# Patient Record
Sex: Female | Born: 1995 | Race: White | Hispanic: No | Marital: Single | Smoking: Current every day smoker
Health system: Southern US, Community
[De-identification: ages and names within clinical notes are randomized; demographics above are authoritative.]

## PROBLEM LIST (undated history)

## (undated) DIAGNOSIS — F1911 Other psychoactive substance abuse, in remission: Secondary | ICD-10-CM

## (undated) DIAGNOSIS — Z789 Other specified health status: Secondary | ICD-10-CM

## (undated) HISTORY — PX: NO PAST SURGERIES: SHX2092

---
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
## 2015-05-27 ENCOUNTER — Emergency Department
Admission: EM | Admit: 2015-05-27 | Discharge: 2015-05-27 | Disposition: A | Discharge status: Home / Self Care | Payer: Self-pay | Attending: Emergency Medicine | Admitting: Emergency Medicine

## 2015-05-27 DIAGNOSIS — F1910 Other psychoactive substance abuse, uncomplicated: Secondary | ICD-10-CM

## 2015-05-27 DIAGNOSIS — F1110 Opioid abuse, uncomplicated: Secondary | ICD-10-CM | POA: Insufficient documentation

## 2015-05-27 NOTE — ED Provider Notes (Signed)
[facility] Emergency Department Provider Note  ____________________________________________  Time seen: Approximately 6:03 PM  I have reviewed the triage vital signs and the nursing notes.   HISTORY  Chief Complaint med clearance    HPI Deborah Lewis is a 19 y.o. female patient here for medical clearance to go to [facility] for heroin area and patient says she is otherwise healthy. Has no complaints does get some chills when she is withdrawing but otherwise has no fever chills coughing sneezing abdominal pain dysuria or any other complaints.   No past medical history on file. patient herself also denies any medical problems  There are no active problems to display for this patient.   No past surgical history on file.  No current outpatient prescriptions on file.  Allergies Review of patient's allergies indicates not on file.  No family history on file.  Social History Social History  Substance Use Topics  . Smoking status: Not on file  . Smokeless tobacco: Not on file  . Alcohol Use: Not on file   patient uses heroin   \Review of Systems Constitutional: No fever/chills Eyes: No visual changes. ENT: No sore throat. Cardiovascular: Denies chest pain. Respiratory: Denies shortness of breath. Gastrointestinal: No abdominal pain.  No nausea, no vomiting.  No diarrhea.  No constipation. Genitourinary: Negative for dysuria. Musculoskeletal: Negative for back pain. Skin: Negative for rash. Neurological: Negative for headaches, focal weakness or numbness.  10-point ROS otherwise negative.  ____________________________________________   PHYSICAL EXAM:  VITAL SIGNS: ED Triage Vitals  Enc Vitals Group     BP 05/27/15 1708 128/73 mmHg     Pulse Rate 05/27/15 1708 75     Resp 05/27/15 1708 18     Temp 05/27/15 1708 97.7 F (36.5 C)     Temp Source 05/27/15 1708 Oral     SpO2 05/27/15 1708 100 %     Weight 05/27/15 1708 99 lb (44.906 kg)   Height 05/27/15 1708  (1.626 m)     Head Cir --      Peak Flow --      Pain Score --      Pain Loc --      Pain Edu? --      Excl. in GC? --     Constitutional: Alert and oriented. Well appearing and in no acute distress. Eyes: Conjunctivae are normal. PERRL. EOMI. Head: Atraumatic. Nose: No congestion/rhinnorhea. Mouth/Throat: Mucous membranes are moist.  Oropharynx non-erythematous. Neck: No stridor.  Cardiovascular: Normal rate, regular rhythm. Grossly normal heart sounds.  Good peripheral circulation. Respiratory: Normal respiratory effort.  No retractions. Lungs CTAB. Gastrointestinal: Soft and nontender. No distention. No abdominal bruits. No CVA tenderness. Tattoos and piercing on abdomen Musculoskeletal: No lower extremity tenderness nor edema.  No joint effusions. Neurologic:  Normal speech and language. No gross focal neurologic deficits are appreciated. No gait instability. Skin:  Skin is warm, dry and intact. No rash noted. Psychiatric: Mood and affect are normal. Speech and behavior are normal.  ____________________________________________   LABS (all labs ordered are listed, but only abnormal results are displayed)  Labs Reviewed - No data to display ____________________________________________  EKG  ______________________________________  RADIOLOGY  ____________________________________________   PROCEDURES   ____________________________________________   INITIAL IMPRESSION / ASSESSMENT AND PLAN / ED COURSE  Pertinent labs & imaging results that were available during my care of the patient were reviewed by me and considered in my medical decision making (see chart for details).   ____________________________________________  FINAL CLINICAL IMPRESSION(S) / ED DIAGNOSES  Final diagnoses:  Drug abuse      Arnaldo Natal, MD 05/27/15 2340

## 2015-05-27 NOTE — ED Notes (Signed)
Here for med clearance for [facility]. Heroin x 2 years.

## 2017-08-21 NOTE — L&D Delivery Note (Addendum)
Delivery Note At 2:44 AM a viable female was delivered via Vaginal, Spontaneous (Presentation: vertex ; LOA ).  APGAR: 8, 9; weight pending .   Placenta status: intact, central cord insertion .  Cord: 3 vessel with the following complications: none.  Cord pH: N/A  Anesthesia:  None Episiotomy: None Lacerations: 2nd degree Suture Repair: 3.0 vicryl Est. Blood Loss (mL):  50  Mom to postpartum.  Baby to Couplet care / Skin to Skin.  Felisa Bonier, MD, PGY-1 Family Medicine - [facility] [place] 01/02/2018, 3:26 AM

## 2017-09-26 ENCOUNTER — Encounter: Payer: Self-pay | Admitting: *Deleted

## 2017-09-26 ENCOUNTER — Ambulatory Visit (INDEPENDENT_AMBULATORY_CARE_PROVIDER_SITE_OTHER): Payer: Self-pay | Admitting: *Deleted

## 2017-09-26 DIAGNOSIS — Z3201 Encounter for pregnancy test, result positive: Secondary | ICD-10-CM

## 2017-09-26 LAB — POCT PREGNANCY, URINE: PREG TEST UR: POSITIVE — AB

## 2017-09-26 NOTE — Progress Notes (Signed)
Patient presents to clinic for pregnancy test which is positive. Patient already took home test and is aware of pregnancy. Would like to start pnc here. Allergies and medications reviewed, pt already taking pnv. Verification of pregnancy given, pt to front to schedule asap appt.

## 2017-09-27 NOTE — Progress Notes (Signed)
I have reviewed the chart and agree with nursing staff's documentation of this patient's encounter.  Catalina AntiguaPeggy Ebb Carelock, MD 09/27/2017 7:23 AM

## 2017-10-24 ENCOUNTER — Encounter: Payer: Self-pay | Admitting: Obstetrics and Gynecology

## 2017-10-24 ENCOUNTER — Other Ambulatory Visit (HOSPITAL_COMMUNITY)
Admission: RE | Admit: 2017-10-24 | Discharge: 2017-10-24 | Disposition: A | Discharge status: Home / Self Care | Payer: Medicaid Other | Source: Ambulatory Visit | Attending: Obstetrics and Gynecology | Admitting: Obstetrics and Gynecology

## 2017-10-24 ENCOUNTER — Ambulatory Visit (INDEPENDENT_AMBULATORY_CARE_PROVIDER_SITE_OTHER): Payer: Medicaid Other | Admitting: Obstetrics and Gynecology

## 2017-10-24 DIAGNOSIS — Z87898 Personal history of other specified conditions: Secondary | ICD-10-CM

## 2017-10-24 DIAGNOSIS — O0930 Supervision of pregnancy with insufficient antenatal care, unspecified trimester: Secondary | ICD-10-CM

## 2017-10-24 DIAGNOSIS — O0933 Supervision of pregnancy with insufficient antenatal care, third trimester: Secondary | ICD-10-CM

## 2017-10-24 DIAGNOSIS — F1911 Other psychoactive substance abuse, in remission: Secondary | ICD-10-CM

## 2017-10-24 DIAGNOSIS — Z3400 Encounter for supervision of normal first pregnancy, unspecified trimester: Secondary | ICD-10-CM

## 2017-10-24 DIAGNOSIS — Z3403 Encounter for supervision of normal first pregnancy, third trimester: Secondary | ICD-10-CM | POA: Insufficient documentation

## 2017-10-24 DIAGNOSIS — O0990 Supervision of high risk pregnancy, unspecified, unspecified trimester: Secondary | ICD-10-CM | POA: Insufficient documentation

## 2017-10-24 NOTE — Patient Instructions (Signed)
Third Trimester of Pregnancy The third trimester is from week 28 through week 40 (months 7 through 9). The third trimester is a time when the unborn baby (fetus) is growing rapidly. At the end of the ninth month, the fetus is about 20 inches in length and weighs 6-10 pounds. Body changes during your third trimester Your body will continue to go through many changes during pregnancy. The changes vary from woman to woman. During the third trimester:  Your weight will continue to increase. You can expect to gain 25-35 pounds (11-16 kg) by the end of the pregnancy.  You may begin to get stretch marks on your hips, abdomen, and breasts.  You may urinate more often because the fetus is moving lower into your pelvis and pressing on your bladder.  You may develop or continue to have heartburn. This is caused by increased hormones that slow down muscles in the digestive tract.  You may develop or continue to have constipation because increased hormones slow digestion and cause the muscles that push waste through your intestines to relax.  You may develop hemorrhoids. These are swollen veins (varicose veins) in the rectum that can itch or be painful.  You may develop swollen, bulging veins (varicose veins) in your legs.  You may have increased body aches in the pelvis, back, or thighs. This is due to weight gain and increased hormones that are relaxing your joints.  You may have changes in your hair. These can include thickening of your hair, rapid growth, and changes in texture. Some women also have hair loss during or after pregnancy, or hair that feels dry or thin. Your hair will most likely return to normal after your baby is born.  Your breasts will continue to grow and they will continue to become tender. A yellow fluid (colostrum) may leak from your breasts. This is the first milk you are producing for your baby.  Your belly button may stick out.  You may notice more swelling in your hands,  face, or ankles.  You may have increased tingling or numbness in your hands, arms, and legs. The skin on your belly may also feel numb.  You may feel short of breath because of your expanding uterus.  You may have more problems sleeping. This can be caused by the size of your belly, increased need to urinate, and an increase in your body's metabolism.  You may notice the fetus "dropping," or moving lower in your abdomen (lightening).  You may have increased vaginal discharge.  You may notice your joints feel loose and you may have pain around your pelvic bone.  What to expect at prenatal visits You will have prenatal exams every 2 weeks until week 36. Then you will have weekly prenatal exams. During a routine prenatal visit:  You will be weighed to make sure you and the baby are growing normally.  Your blood pressure will be taken.  Your abdomen will be measured to track your baby's growth.  The fetal heartbeat will be listened to.  Any test results from the previous visit will be discussed.  You may have a cervical check near your due date to see if your cervix has softened or thinned (effaced).  You will be tested for Group B streptococcus. This happens between 35 and 37 weeks.  Your health care provider may ask you:  What your birth plan is.  How you are feeling.  If you are feeling the baby move.  If you have had   any abnormal symptoms, such as leaking fluid, bleeding, severe headaches, or abdominal cramping.  If you are using any tobacco products, including cigarettes, chewing tobacco, and electronic cigarettes.  If you have any questions.  Other tests or screenings that may be performed during your third trimester include:  Blood tests that check for low iron levels (anemia).  Fetal testing to check the health, activity level, and growth of the fetus. Testing is done if you have certain medical conditions or if there are problems during the  pregnancy.  Nonstress test (NST). This test checks the health of your baby to make sure there are no signs of problems, such as the baby not getting enough oxygen. During this test, a belt is placed around your belly. The baby is made to move, and its heart rate is monitored during movement.  What is false labor? False labor is a condition in which you feel small, irregular tightenings of the muscles in the womb (contractions) that usually go away with rest, changing position, or drinking water. These are called Braxton Hicks contractions. Contractions may last for hours, days, or even weeks before true labor sets in. If contractions come at regular intervals, become more frequent, increase in intensity, or become painful, you should see your health care provider. What are the signs of labor?  Abdominal cramps.  Regular contractions that start at 10 minutes apart and become stronger and more frequent with time.  Contractions that start on the top of the uterus and spread down to the lower abdomen and back.  Increased pelvic pressure and dull back pain.  A watery or bloody mucus discharge that comes from the vagina.  Leaking of amniotic fluid. This is also known as your "water breaking." It could be a slow trickle or a gush. Let your health care provider know if it has a color or strange odor. If you have any of these signs, call your health care provider right away, even if it is before your due date. Follow these instructions at home: Medicines  Follow your health care provider's instructions regarding medicine use. Specific medicines may be either safe or unsafe to take during pregnancy.  Take a prenatal vitamin that contains at least 600 micrograms (mcg) of folic acid.  If you develop constipation, try taking a stool softener if your health care provider approves. Eating and drinking  Eat a balanced diet that includes fresh fruits and vegetables, whole grains, good sources of protein  such as meat, eggs, or tofu, and low-fat dairy. Your health care provider will help you determine the amount of weight gain that is right for you.  Avoid raw meat and uncooked cheese. These carry germs that can cause birth defects in the baby.  If you have low calcium intake from food, talk to your health care provider about whether you should take a daily calcium supplement.  Eat four or five small meals rather than three large meals a day.  Limit foods that are high in fat and processed sugars, such as fried and sweet foods.  To prevent constipation: ? Drink enough fluid to keep your urine clear or pale yellow. ? Eat foods that are high in fiber, such as fresh fruits and vegetables, whole grains, and beans. Activity  Exercise only as directed by your health care provider. Most women can continue their usual exercise routine during pregnancy. Try to exercise for 30 minutes at least 5 days a week. Stop exercising if you experience uterine contractions.  Avoid heavy   lifting.  Do not exercise in extreme heat or humidity, or at high altitudes.  Wear low-heel, comfortable shoes.  Practice good posture.  You may continue to have sex unless your health care provider tells you otherwise. Relieving pain and discomfort  Take frequent breaks and rest with your legs elevated if you have leg cramps or low back pain.  Take warm sitz baths to soothe any pain or discomfort caused by hemorrhoids. Use hemorrhoid cream if your health care provider approves.  Wear a good support bra to prevent discomfort from breast tenderness.  If you develop varicose veins: ? Wear support pantyhose or compression stockings as told by your healthcare provider. ? Elevate your feet for 15 minutes, 3-4 times a day. Prenatal care  Write down your questions. Take them to your prenatal visits.  Keep all your prenatal visits as told by your health care provider. This is important. Safety  Wear your seat belt at  all times when driving.  Make a list of emergency phone numbers, including numbers for family, friends, the hospital, and police and fire departments. General instructions  Avoid cat litter boxes and soil used by cats. These carry germs that can cause birth defects in the baby. If you have a cat, ask someone to clean the litter box for you.  Do not travel far distances unless it is absolutely necessary and only with the approval of your health care provider.  Do not use hot tubs, steam rooms, or saunas.  Do not drink alcohol.  Do not use any products that contain nicotine or tobacco, such as cigarettes and e-cigarettes. If you need help quitting, ask your health care provider.  Do not use any medicinal herbs or unprescribed drugs. These chemicals affect the formation and growth of the baby.  Do not douche or use tampons or scented sanitary pads.  Do not cross your legs for long periods of time.  To prepare for the arrival of your baby: ? Take prenatal classes to understand, practice, and ask questions about labor and delivery. ? Make a trial run to the hospital. ? Visit the hospital and tour the maternity area. ? Arrange for maternity or paternity leave through employers. ? Arrange for family and friends to take care of pets while you are in the hospital. ? Purchase a rear-facing car seat and make sure you know how to install it in your car. ? Pack your hospital bag. ? Prepare the baby's nursery. Make sure to remove all pillows and stuffed animals from the baby's crib to prevent suffocation.  Visit your dentist if you have not gone during your pregnancy. Use a soft toothbrush to brush your teeth and be gentle when you floss. Contact a health care provider if:  You are unsure if you are in labor or if your water has broken.  You become dizzy.  You have mild pelvic cramps, pelvic pressure, or nagging pain in your abdominal area.  You have lower back pain.  You have persistent  nausea, vomiting, or diarrhea.  You have an unusual or bad smelling vaginal discharge.  You have pain when you urinate. Get help right away if:  Your water breaks before 37 weeks.  You have regular contractions less than 5 minutes apart before 37 weeks.  You have a fever.  You are leaking fluid from your vagina.  You have spotting or bleeding from your vagina.  You have severe abdominal pain or cramping.  You have rapid weight loss or weight gain.    You have shortness of breath with chest pain.  You notice sudden or extreme swelling of your face, hands, ankles, feet, or legs.  Your baby makes fewer than 10 movements in 2 hours.  You have severe headaches that do not go away when you take medicine.  You have vision changes. Summary  The third trimester is from week 28 through week 40, months 7 through 9. The third trimester is a time when the unborn baby (fetus) is growing rapidly.  During the third trimester, your discomfort may increase as you and your baby continue to gain weight. You may have abdominal, leg, and back pain, sleeping problems, and an increased need to urinate.  During the third trimester your breasts will keep growing and they will continue to become tender. A yellow fluid (colostrum) may leak from your breasts. This is the first milk you are producing for your baby.  False labor is a condition in which you feel small, irregular tightenings of the muscles in the womb (contractions) that eventually go away. These are called Braxton Hicks contractions. Contractions may last for hours, days, or even weeks before true labor sets in.  Signs of labor can include: abdominal cramps; regular contractions that start at 10 minutes apart and become stronger and more frequent with time; watery or bloody mucus discharge that comes from the vagina; increased pelvic pressure and dull back pain; and leaking of amniotic fluid. This information is not intended to replace advice  given to you by your health care provider. Make sure you discuss any questions you have with your health care provider. Document Released: 08/01/2001 Document Revised: 01/13/2016 Document Reviewed: 10/08/2012 Elsevier Interactive Patient Education  2017 [employer]  

## 2017-10-24 NOTE — Addendum Note (Signed)
Addended by: Natale MilchSTALLING, Xzavion Doswell D on: 10/24/2017 04:37 PM   Modules accepted: Orders

## 2017-10-24 NOTE — Progress Notes (Signed)
Subjective:  Deborah Lewis is a 22 y.o. G1P0 at 7180w0d being seen today for her first OB visit. EDD by uncertain LMP. Late prenatal care d/t to insurance reasons. No chronic medical problems or medications except H/O heroin drug abuse. Reports no use since June  She is currently monitored for the following issues for this high-risk pregnancy and has Supervision of normal first pregnancy, antepartum; Late prenatal care; and History of drug abuse on their problem list.  Patient reports no complaints.  Contractions: Not present. Vag. Bleeding: None.  Movement: Present. Denies leaking of fluid.   The following portions of the patient's history were reviewed and updated as appropriate: allergies, current medications, past family history, past medical history, past social history, past surgical history and problem list. Problem list updated.  Objective:   Vitals:   10/24/17 1451  BP: 115/84  Pulse: (!) 121  Weight: 46.9 kg (103 lb 6.4 oz)    Fetal Status: Fetal Heart Rate (bpm): 125   Movement: Present     General:  Alert, oriented and cooperative. Patient is in no acute distress.  Skin: Skin is warm and dry. No rash noted.   Cardiovascular: Normal heart rate noted  Respiratory: Normal respiratory effort, no problems with respiration noted  Abdomen: Soft, gravid, appropriate for gestational age. Pain/Pressure: Absent     Pelvic:  Cervical exam performed        Extremities: Normal range of motion.  Edema: None  Mental Status: Normal mood and affect. Normal behavior. Normal judgment and thought content.  Breast sym supple no nipple discharge no masses or adenopathy  Urinalysis:      Assessment and Plan:  Pregnancy: G1P0 at 5580w0d  1. Supervision of normal first pregnancy, antepartum Prenatal care and labs reviewed with pt Declines Flu vaccine - Cytology - PAP - Cervicovaginal ancillary only - Hemoglobinopathy evaluation - Cystic Fibrosis Mutation 97 - Culture, OB Urine - Obstetric  Panel, Including HIV - Genetic Screening - Hemoglobin A1c - US MFM OB COMP + 14 WK; Future  2. Late prenatal care   3. History of drug abuse UDS  Preterm labor symptoms and general obstetric precautions including but not limited to vaginal bleeding, contractions, leaking of fluid and fetal movement were reviewed in detail with the patient. Please refer to After Visit Summary for other counseling recommendations.  Return in about 2 weeks (around 11/07/2017) for OB visit.   Hermina StaggersErvin, [name], MD

## 2017-10-24 NOTE — Progress Notes (Signed)
Patient reports good fetal movement, unsure about exact dating. Pt denies pain and but complains of nausea and heartburn. FOB is involved. Pt is unsure of dating.

## 2017-10-24 NOTE — Addendum Note (Signed)
Addended by: Natale MilchSTALLING, BRITTANY D on: 10/24/2017 03:53 PM   Modules accepted: Orders

## 2017-10-26 LAB — CYTOLOGY - PAP
Adequacy: ABSENT
CHLAMYDIA, DNA PROBE: NEGATIVE
Candida vaginitis: NEGATIVE
Diagnosis: NEGATIVE
HPV: NOT DETECTED
Neisseria Gonorrhea: NEGATIVE
TRICH (WINDOWPATH): NEGATIVE

## 2017-10-30 ENCOUNTER — Ambulatory Visit (HOSPITAL_COMMUNITY)
Admission: RE | Admit: 2017-10-30 | Discharge: 2017-10-30 | Disposition: A | Discharge status: Home / Self Care | Payer: Medicaid Other | Source: Ambulatory Visit | Attending: Obstetrics and Gynecology | Admitting: Obstetrics and Gynecology

## 2017-10-30 ENCOUNTER — Other Ambulatory Visit: Payer: Self-pay | Admitting: Obstetrics and Gynecology

## 2017-10-30 ENCOUNTER — Encounter: Payer: Self-pay | Admitting: *Deleted

## 2017-10-30 ENCOUNTER — Other Ambulatory Visit: Payer: Medicaid Other

## 2017-10-30 DIAGNOSIS — Z3A29 29 weeks gestation of pregnancy: Secondary | ICD-10-CM

## 2017-10-30 DIAGNOSIS — O0933 Supervision of pregnancy with insufficient antenatal care, third trimester: Secondary | ICD-10-CM

## 2017-10-30 DIAGNOSIS — Z3689 Encounter for other specified antenatal screening: Secondary | ICD-10-CM

## 2017-10-30 DIAGNOSIS — Z3400 Encounter for supervision of normal first pregnancy, unspecified trimester: Secondary | ICD-10-CM

## 2017-10-31 ENCOUNTER — Other Ambulatory Visit: Payer: Self-pay | Admitting: *Deleted

## 2017-10-31 DIAGNOSIS — Z3493 Encounter for supervision of normal pregnancy, unspecified, third trimester: Secondary | ICD-10-CM

## 2017-10-31 NOTE — Progress Notes (Signed)
U/s orders entered per Dr Alysia PennaErvin order.  F/u for anatomy and growth.

## 2017-11-05 LAB — OBSTETRIC PANEL, INCLUDING HIV
Antibody Screen: NEGATIVE
BASOS ABS: 0 10*3/uL (ref 0.0–0.2)
Basos: 0 %
EOS (ABSOLUTE): 0.1 10*3/uL (ref 0.0–0.4)
Eos: 1 %
HIV SCREEN 4TH GENERATION: NONREACTIVE
Hematocrit: 34.1 % (ref 34.0–46.6)
Hemoglobin: 11.7 g/dL (ref 11.1–15.9)
Hepatitis B Surface Ag: NEGATIVE
IMMATURE GRANULOCYTES: 0 %
Immature Grans (Abs): 0 10*3/uL (ref 0.0–0.1)
LYMPHS ABS: 2.1 10*3/uL (ref 0.7–3.1)
Lymphs: 14 %
MCH: 31.4 pg (ref 26.6–33.0)
MCHC: 34.3 g/dL (ref 31.5–35.7)
MCV: 91 fL (ref 79–97)
MONOS ABS: 0.8 10*3/uL (ref 0.1–0.9)
Monocytes: 5 %
NEUTROS ABS: 11.8 10*3/uL — AB (ref 1.4–7.0)
NEUTROS PCT: 80 %
PLATELETS: 408 10*3/uL — AB (ref 150–379)
RBC: 3.73 x10E6/uL — AB (ref 3.77–5.28)
RDW: 13.5 % (ref 12.3–15.4)
RPR Ser Ql: NONREACTIVE
Rh Factor: NEGATIVE
Rubella Antibodies, IGG: 2.46 index (ref >0.99)
WBC: 14.9 10*3/uL — AB (ref 3.4–10.8)

## 2017-11-05 LAB — HEMOGLOBINOPATHY EVALUATION
HGB C: 0 %
HGB S: 0 %
HGB VARIANT: 0 %
Hemoglobin A2 Quantitation: 2.5 % (ref 1.8–3.2)
Hemoglobin F Quantitation: 0 % (ref 0.0–2.0)
Hgb A: 97.5 % (ref 96.4–98.8)

## 2017-11-05 LAB — HEMOGLOBIN A1C
ESTIMATED AVERAGE GLUCOSE: 103 mg/dL
Hgb A1c MFr Bld: 5.2 % (ref 4.8–5.6)

## 2017-11-05 LAB — CYSTIC FIBROSIS MUTATION 97

## 2017-11-07 ENCOUNTER — Other Ambulatory Visit: Payer: Self-pay

## 2017-11-07 ENCOUNTER — Ambulatory Visit (INDEPENDENT_AMBULATORY_CARE_PROVIDER_SITE_OTHER): Payer: Medicaid Other | Admitting: Obstetrics & Gynecology

## 2017-11-07 ENCOUNTER — Encounter: Payer: Self-pay | Admitting: Obstetrics and Gynecology

## 2017-11-07 VITALS — BP 111/67 | HR 81 | Wt 103.2 lb

## 2017-11-07 DIAGNOSIS — Z141 Cystic fibrosis carrier: Secondary | ICD-10-CM | POA: Insufficient documentation

## 2017-11-07 DIAGNOSIS — O26899 Other specified pregnancy related conditions, unspecified trimester: Secondary | ICD-10-CM

## 2017-11-07 DIAGNOSIS — Z3400 Encounter for supervision of normal first pregnancy, unspecified trimester: Secondary | ICD-10-CM

## 2017-11-07 DIAGNOSIS — Z6791 Unspecified blood type, Rh negative: Secondary | ICD-10-CM | POA: Insufficient documentation

## 2017-11-07 DIAGNOSIS — O0930 Supervision of pregnancy with insufficient antenatal care, unspecified trimester: Secondary | ICD-10-CM

## 2017-11-07 MED ORDER — PANTOPRAZOLE SODIUM 20 MG PO TBEC: 20.0000 mg | DELAYED_RELEASE_TABLET | Freq: Every day | ORAL | 2 refills | Status: DC

## 2017-11-07 NOTE — Progress Notes (Signed)
ROB. C/o NV, headaches 6/10 x 30 days.

## 2017-11-07 NOTE — Progress Notes (Signed)
   PRENATAL VISIT NOTE  Subjective:  Deborah Lewis is a 22 y.o. G1P0 at 1849w3d being seen today for ongoing prenatal care.  She is currently monitored for the following issues for this low-risk pregnancy and has Supervision of normal first pregnancy, antepartum; Late prenatal care; and History of drug abuse on their problem list.  Patient reports headache, heartburn and nausea.  Contractions: Not present. Vag. Bleeding: None.  Movement: Present. Denies leaking of fluid.   The following portions of the patient's history were reviewed and updated as appropriate: allergies, current medications, past family history, past medical history, past social history, past surgical history and problem list. Problem list updated.  Objective:   Vitals:   11/07/17 0950  BP: 111/67  Pulse: 81  Weight: 103 lb 3.2 oz (46.8 kg)    Fetal Status: Fetal Heart Rate (bpm): 149 Fundal Height: 30 cm Movement: Present     General:  Alert, oriented and cooperative. Patient is in no acute distress.  Skin: Skin is warm and dry. No rash noted.   Cardiovascular: Normal heart rate noted  Respiratory: Normal respiratory effort, no problems with respiration noted  Abdomen: Soft, gravid, appropriate for gestational age.  Pain/Pressure: Absent     Pelvic: Cervical exam deferred        Extremities: Normal range of motion.  Edema: None  Mental Status:  Normal mood and affect. Normal behavior. Normal judgment and thought content.   Assessment and Plan:  Pregnancy: G1P0 at 5249w3d  1. Supervision of normal first pregnancy, antepartum Reflux and nausea - pantoprazole (PROTONIX) 20 MG tablet; Take 1 tablet (20 mg total) by mouth daily.  Dispense: 30 tablet; Refill: 2  2. Late prenatal care Mild headache relieved by Tylenol  Preterm labor symptoms and general obstetric precautions including but not limited to vaginal bleeding, contractions, leaking of fluid and fetal movement were reviewed in detail with the  patient. Please refer to After Visit Summary for other counseling recommendations.  Return in about 2 weeks (around 11/21/2017). 2 hr GTT NV  Deborah DarterJames Bonney Berres, MD

## 2017-11-07 NOTE — Patient Instructions (Signed)

## 2017-11-12 ENCOUNTER — Other Ambulatory Visit: Payer: Medicaid Other

## 2017-11-12 ENCOUNTER — Other Ambulatory Visit: Payer: Self-pay | Admitting: *Deleted

## 2017-11-12 DIAGNOSIS — Z3493 Encounter for supervision of normal pregnancy, unspecified, third trimester: Secondary | ICD-10-CM

## 2017-11-12 NOTE — Progress Notes (Signed)
Order for MFM consult for +CF screening. Per Dr Alysia PennaErvin request.

## 2017-11-13 LAB — CBC

## 2017-11-13 LAB — RPR: RPR: NONREACTIVE

## 2017-11-13 LAB — HIV ANTIBODY (ROUTINE TESTING W REFLEX): HIV SCREEN 4TH GENERATION: NONREACTIVE

## 2017-11-13 LAB — GLUCOSE TOLERANCE, 1 HOUR: GLUCOSE, 1HR PP: 69 mg/dL (ref 65–199)

## 2017-11-21 ENCOUNTER — Encounter: Payer: Self-pay | Admitting: Obstetrics & Gynecology

## 2017-11-21 ENCOUNTER — Ambulatory Visit (HOSPITAL_COMMUNITY)
Admission: RE | Admit: 2017-11-21 | Discharge: 2017-11-21 | Disposition: A | Discharge status: Home / Self Care | Payer: Medicaid Other | Source: Ambulatory Visit | Attending: Certified Nurse Midwife | Admitting: Certified Nurse Midwife

## 2017-11-21 ENCOUNTER — Ambulatory Visit (INDEPENDENT_AMBULATORY_CARE_PROVIDER_SITE_OTHER): Payer: Medicaid Other | Admitting: Obstetrics & Gynecology

## 2017-11-21 DIAGNOSIS — O352XX0 Maternal care for (suspected) hereditary disease in fetus, not applicable or unspecified: Secondary | ICD-10-CM

## 2017-11-21 DIAGNOSIS — Z3400 Encounter for supervision of normal first pregnancy, unspecified trimester: Secondary | ICD-10-CM

## 2017-11-21 DIAGNOSIS — Z3403 Encounter for supervision of normal first pregnancy, third trimester: Secondary | ICD-10-CM

## 2017-11-21 DIAGNOSIS — Z3A32 32 weeks gestation of pregnancy: Secondary | ICD-10-CM | POA: Insufficient documentation

## 2017-11-21 LAB — POCT URINALYSIS DIPSTICK
Bilirubin, UA: NEGATIVE
Blood, UA: NEGATIVE
Glucose, UA: NEGATIVE
Ketones, UA: NEGATIVE
LEUKOCYTES UA: NEGATIVE
NITRITE UA: POSITIVE
Odor: POSITIVE
PH UA: 7.5 (ref 5.0–8.0)
PROTEIN UA: NEGATIVE
Spec Grav, UA: 1.01 (ref 1.010–1.025)
UROBILINOGEN UA: 0.2 U/dL

## 2017-11-21 NOTE — Patient Instructions (Signed)
CF Gene Mutation Testing CF gene mutation testing is a test to check for the type of gene that causes cystic fibrosis (CF). Cystic fibrosis is a condition that affects how salt moves in and out of cells. It results in lifelong problems with breathing and digestion. Genes provide the coding that determines things such as our hair and eye color, how tall we are, and what medical conditions we might develop. When a gene changes form, it is called a mutation. Cystic fibrosis is caused by this type of genetic mutation. We get one set of genetic codes from each of our biological parents. When both parents have the CF gene mutation, they can pass the condition to their children (hereditary). In this case, the child will have cystic fibrosis. If a child only receives one copy of the CF gene mutation, the child is a carrier. The child will not develop cystic fibrosis, but he or she can pass the gene mutation onto his or her children. It is possible to be a carrier of the CF gene mutation, even if you are healthy. Why am I having this test? Some people choose to be tested for the CF gene mutation before having children. A woman might choose to be tested during pregnancy (prenatal testing) to determine the risk of passing cystic fibrosis to her baby. In some states in the [place] this testing is required. Your health care provider may also recommend this test if you have signs and symptoms of cystic fibrosis or if you know that someone in your family has this condition. This test is a reliable way of determining whether you have cystic fibrosis. The test cannot determine how bad your symptoms will be. Some groups of people have a higher risk of carrying the CF gene mutation, such as:  People who are Caucasian.  People of Ashkenazi Jewish descent.  What kind of sample is taken? In most cases, a blood sample is taken for this test. For a woman who is having the test during pregnancy, the test requires a sample of  the fluid that surrounds the baby in the uterus (amniotic fluid) or a sample of tissue from the placenta. How do I prepare for this test? There is no preparation required for this test. How are the results reported? Your test results will be reported as either positive or negative. It is your responsibility to get your test results. Ask your health care provider or the department performing the test when your results will be ready. What do the results mean? A negative result on a test means that something is not present. A negative test result can show different findings. 1. If the test result is negative and you have no symptoms: ? It is likely that you do not have cystic fibrosis. ? It is likely that you are not a carrier of the gene mutation. ? Your chances of having a child with cystic fibrosis are low. 2. If the test result is negative but you have symptoms of cystic fibrosis: ? You may have a rare form of the condition or you may have another disease. ? Your chances of having a child with cystic fibrosis are slightly higher.  A positive result on a test means that something is present. A positive test result can show different findings. 1. If the test result indicates that you have a single mutation, and if you have no symptoms of the disease: ? You might be a carrier of the gene mutation. This increases your  chances of having a child with cystic fibrosis. 2. If the test result indicates that you have two gene mutations (one from each of your parents): ? It is likely that you have cystic fibrosis. 3. If you have CF gene mutation testing done before pregnancy or during pregnancy, and if you and your partner both have at least one copy of the CF gene mutation: ? Your chances of having a child with cystic fibrosis are high.  Talk with your health care provider to discuss your results, treatment options, and if necessary, the need for more tests. Talk with your health care provider if you  have any questions about your results. Talk with your health care provider to discuss your results, treatment options, and if necessary, the need for more tests. Talk with your health care provider if you have any questions about your results. This information is not intended to replace advice given to you by your health care provider. Make sure you discuss any questions you have with your health care provider. Document Released: 08/31/2004 Document Revised: 04/11/2016 Document Reviewed: 06/09/2015 Elsevier Interactive Patient Education  [employer]2018 Elsevier Inc.

## 2017-11-21 NOTE — Progress Notes (Signed)
   PRENATAL VISIT NOTE  Subjective:  Deborah Lewis is a 22 y.o. G1P0 at 6064w3d being seen today for ongoing prenatal care.  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  Patient reports no complaints.  Contractions: Not present. Vag. Bleeding: None.  Movement: Present. Denies leaking of fluid.   The following portions of the patient's history were reviewed and updated as appropriate: allergies, current medications, past family history, past medical history, past social history, past surgical history and problem list. Problem list updated.  Objective:   Vitals:   11/21/17 0920  BP: 117/70  Pulse: 67  Weight: 106 lb (48.1 kg)    Fetal Status: Fetal Heart Rate (bpm): 132 Fundal Height: 30 cm Movement: Present     General:  Alert, oriented and cooperative. Patient is in no acute distress.  Skin: Skin is warm and dry. No rash noted.   Cardiovascular: Normal heart rate noted  Respiratory: Normal respiratory effort, no problems with respiration noted  Abdomen: Soft, gravid, appropriate for gestational age.  Pain/Pressure: Absent     Pelvic: Cervical exam deferred        Extremities: Normal range of motion.  Edema: None  Mental Status: Normal mood and affect. Normal behavior. Normal judgment and thought content.   Assessment and Plan:  Pregnancy: G1P0 at 2464w3d  1. Supervision of normal first pregnancy, antepartum Needs urine testing - Culture, OB Urine - ToxASSURE Select 13 (MW), Urine  Preterm labor symptoms and general obstetric precautions including but not limited to vaginal bleeding, contractions, leaking of fluid and fetal movement were reviewed in detail with the patient. Please refer to After Visit Summary for other counseling recommendations.  Return in about 2  weeks (around 12/05/2017).  Future Appointments  Date Time Provider Department Center  11/21/2017 10:00 AM [facility] GENETIC COUNSELING [facility]  11/27/2017 10:45 AM [facility] US 2 [facility]  12/05/2017  9:30 AM Hermina StaggersErvin, Michael L, MD [facility] None    Scheryl DarterJames Arnold, MD

## 2017-11-21 NOTE — Progress Notes (Signed)
Genetic Counseling  Visit Summary Note  Appointment Date: 11/21/2017 Referred By: Morene Crocker, CNM  Date of Birth: 1996/04/14  Pregnancy history: G1P0 Estimated Date of Delivery: 01/13/18 Estimated Gestational Age: [redacted]w[redacted]d I met with [name] and her partner, Deborah Lewis for genetic counseling because routine cystic fibrosis carrier screening identified Deborah Lewis as a carrier for cystic fibrosis (CF).    In summary:  Discussed cystic fibrosis and autosomal recessive inheritance  Discussed availability of carrier screening for the father of the baby  Reviewed limitations of screening  Reviewed risks to the fetus prior to carrier screening for the father of the baby  Discussed option of prenatal diagnosis only when mutations have been identified in both parents  Declined amniocentesis at this time  Understand limitation of ultrasound in diagnosis of cf  No other family history concerns  We reviewed the results of Deborah Lewis's CF carrier screening.  Specifically, the name of the CFTR gene mutation she carries is [id number]. CF carrier screening has not yet been performed for her partner, [name]  [name] no additional relatives known to be CF carriers and no known relatives with cystic fibrosis.  Deborah Lewis no known individuals with cystic fibrosis in his family history, and consanguinity to Deborah Lewis was denied. He reported unknown Caucasian ancestry.   This couple was provided with written information regarding cystic fibrosis. Classic features of CF include thickened secretions in the lungs, digestive and reproductive systems. This life-limiting condition is characterized by chronic respiratory infections requiring daily chest therapies and pancreatic dysfunction disrupting the body's ability to break down food and extract nutrients as it should, which may restrict growth. Infertility commonly occurs in males. With therapies,  such as daily respiratory therapies and medications to aid digestion, the median lifespan for people with CF is increasing. Treatment may involve lung transplantation in some cases. There can be significant variability in the severity of symptoms and expression of the disease. Expression of the disease depends upon the specific mutations present in an individual with CF.   We spent time reviewing the autosomal recessive inheritance of CF. CF is a common genetic condition in the Caucasian population occurring in approximately 1 in 389,300Caucasian births. This means approximately 1 in 27 Caucasians is a CF carrier. We discussed that individuals who are carriers have one copy of the CFTR gene with a disease causing mutation, and their other CFTR gene copy functions correctly. Thus, carriers typically do not have associated medical symptoms. We discussed that when both parents are carriers for CF, each pregnancy has an independent chance for one of the following outcomes: a 25% chance to inherit both mutations and thus have CF; a 50% chance to inherit one gene mutation and be a carrier similar to parents; and a 25% chance to be neither a carrier nor have CF. When one parent is a CF carrier but the other is not, then each pregnancy has a 1 in 2 chance to be a CF carrier but would not be expected to be at increased risk to inherit CF.   There are known to be thousands of mutations which can cause the CFTR gene to not function properly. Carrier screening is available to assess for the most common disease causing mutations. However, carrier screening does not identify all CF carriers. Thus, a negative CF carrier screen would reduce, but not eliminate, the chance to be a CF carrier and thus the chance for CF in  a pregnancy.  Carrier screening for the most common mutations detects approximately 90% of carriers in the Caucasian population.  We reviewed that when both parents are identified to be CF carriers, prenatal  diagnosis via amniocentesis would be available, if desired. The risks, benefits, and limitations of amniocentesis were reviewed. A fetus with cystic fibrosis typically appears normal on targeted ultrasound, although rarely echogenic bowel is visualized. However, the presence of echogenic bowel on targeted ultrasound is not diagnostic for CF in a pregnancy, nor does the absence of echogenic bowel on ultrasound rule out CF in the pregnancy. We discussed that postnatal testing for CF can also be performed for babies identified to be at risk to inherit CF. They understand that in [place], the newborn screening test will detect CF, but that carriers may come back as false positives.  Given that Deborah Lewis reports no family history of CF, he would have the general population chance to be a carrier, or approximately 1 in 27, prior to carrier screening. Thus, if Deborah Lewis did not pursue carrier screening, the chance for an affected pregnancy is approximately 1 in 1.  We discussed that CF carrier screening for Deborah Lewis would further refine the risk for CF in the current pregnancy. Deborah Lewis declined carrier screening.  They would prefer to have a less than 1% chance for CF than worry about a 25% chance.    Deborah Lewis denied exposure to environmental toxins or chemical agents. She denied the use of alcohol, tobacco or street drugs. She denied significant viral illnesses during the course of her pregnancy. Her medical and surgical histories were noncontributory. Both family histories were reviewed and were otherwise negative for birth defects, mental retardation, and known genetic conditions. Without further information regarding the provided family history, an accurate genetic risk cannot be calculated. Further genetic counseling is warranted if more information is obtained.  I counseled this couple regarding the above risks and available options.  The approximate face-to-face time with the genetic  counselor was 45 minutes.  Cam Hai, MS Certified Genetic Counselor

## 2017-11-23 ENCOUNTER — Encounter: Payer: Self-pay | Admitting: *Deleted

## 2017-11-24 LAB — URINE CULTURE, OB REFLEX

## 2017-11-24 LAB — CULTURE, OB URINE

## 2017-11-27 ENCOUNTER — Other Ambulatory Visit: Payer: Self-pay | Admitting: Obstetrics and Gynecology

## 2017-11-27 ENCOUNTER — Ambulatory Visit (HOSPITAL_COMMUNITY)
Admission: RE | Admit: 2017-11-27 | Discharge: 2017-11-27 | Disposition: A | Discharge status: Home / Self Care | Payer: Medicaid Other | Source: Ambulatory Visit | Attending: Obstetrics and Gynecology | Admitting: Obstetrics and Gynecology

## 2017-11-27 ENCOUNTER — Other Ambulatory Visit (HOSPITAL_COMMUNITY): Payer: Self-pay | Admitting: *Deleted

## 2017-11-27 DIAGNOSIS — O0933 Supervision of pregnancy with insufficient antenatal care, third trimester: Secondary | ICD-10-CM | POA: Insufficient documentation

## 2017-11-27 DIAGNOSIS — Z3493 Encounter for supervision of normal pregnancy, unspecified, third trimester: Secondary | ICD-10-CM

## 2017-11-27 DIAGNOSIS — Z3A33 33 weeks gestation of pregnancy: Secondary | ICD-10-CM

## 2017-11-27 DIAGNOSIS — Z362 Encounter for other antenatal screening follow-up: Secondary | ICD-10-CM

## 2017-11-27 LAB — TOXASSURE SELECT 13 (MW), URINE

## 2017-11-27 NOTE — Addendum Note (Signed)
Encounter addended by: Vivien RotaSmall, Barry Culverhouse H, RT on: 11/27/2017 11:51 AM  Actions taken: Imaging Exam ended

## 2017-12-05 ENCOUNTER — Encounter: Payer: Medicaid Other | Admitting: Obstetrics and Gynecology

## 2017-12-11 ENCOUNTER — Encounter: Payer: Self-pay | Admitting: Obstetrics & Gynecology

## 2017-12-11 ENCOUNTER — Ambulatory Visit (INDEPENDENT_AMBULATORY_CARE_PROVIDER_SITE_OTHER): Payer: Medicaid Other | Admitting: Obstetrics & Gynecology

## 2017-12-11 VITALS — BP 99/67 | HR 71 | Wt 104.5 lb

## 2017-12-11 DIAGNOSIS — O365990 Maternal care for other known or suspected poor fetal growth, unspecified trimester, not applicable or unspecified: Secondary | ICD-10-CM | POA: Insufficient documentation

## 2017-12-11 DIAGNOSIS — O0930 Supervision of pregnancy with insufficient antenatal care, unspecified trimester: Secondary | ICD-10-CM

## 2017-12-11 DIAGNOSIS — Z3400 Encounter for supervision of normal first pregnancy, unspecified trimester: Secondary | ICD-10-CM

## 2017-12-11 DIAGNOSIS — Z141 Cystic fibrosis carrier: Secondary | ICD-10-CM

## 2017-12-11 DIAGNOSIS — Z6791 Unspecified blood type, Rh negative: Secondary | ICD-10-CM

## 2017-12-11 DIAGNOSIS — O26849 Uterine size-date discrepancy, unspecified trimester: Secondary | ICD-10-CM

## 2017-12-11 DIAGNOSIS — O26899 Other specified pregnancy related conditions, unspecified trimester: Secondary | ICD-10-CM

## 2017-12-11 DIAGNOSIS — O352XX0 Maternal care for (suspected) hereditary disease in fetus, not applicable or unspecified: Secondary | ICD-10-CM

## 2017-12-11 MED ORDER — PANTOPRAZOLE SODIUM 20 MG PO TBEC: 20.0000 mg | DELAYED_RELEASE_TABLET | Freq: Every day | ORAL | 2 refills | Status: DC

## 2017-12-11 NOTE — Patient Instructions (Signed)
Natural Childbirth Natural childbirth is going through labor and delivery without any drugs to relieve pain. Additionally, fetal monitors are not used, a delivery is not done, and a surgical cut to enlarge the vaginal opening (episiotomy) is not made. With the help of a birthing professional (midwife or health care provider), you direct your own labor and delivery. Many women choose natural childbirth because it makes them feel more in control and in touch with their labor and delivery. Some woman also choose natural childbirth because they are concerned about medicines affecting them and their babies. Pregnant women with a high-risk pregnancy should not attempt natural childbirth. It is better to deliver the infant in a hospital if an emergency situation arises. Sometimes, a health care provider has to get involved for the health and safety of the mother and infant. Techniques for natural childbirth  The Lamaze method-This method teaches parents that having a baby is normal, healthy, and natural. It also teaches the mother to take a neutral position regarding pain medicine and numbing medicines and to make an informed decision about using these medicines when the time comes.  The Bradley method (also called husband-coached birth)-This method teaches the father or partner to be the birth coach. It encourages the mother to exercise and eat a balanced, nutritious diet. It also involves relaxation and deep breathing exercises and preparing the parents for emergency situations. Methods of dealing with labor pain and delivery  Meditation.  Yoga.  Hypnosis.  Acupuncture.  Massage.  Changing positions (walking, rocking, showering, leaning on birth balls).  Lying in warm water or a whirlpool bath.  Finding an activity that keeps your mind off of the labor pain.  Listening to soft music.  Focusing on a particular object (visual imagery). Before going into labor  Be sure you and your spouse or  partner are in agreement about having a natural childbirth.  Decide if your health care provider or a midwife will deliver your baby.  Decide if you will have your baby in the hospital, at a birthing center, or at home.  If you have children, make plans to have someone take care of them when you go to the hospital or birthing center.  Know the distance and the time it takes to go to the hospital or birthing center. Practice going there and time it to be sure.  Have a bag packed with a nightgown, bathrobe, and toiletries. Be ready to take it with you when you go into labor.  Keep phone numbers of your family and friends handy if you need to call someone when you go into labor.  Your spouse or partner should go to all the natural childbirth technique classes.  Talk with your health care provider about the possibility of a medical emergency and what will happen if that occurs. Advantages of natural childbirth  You are in control of your labor and delivery.  You will not take medicines that could affect you and the baby.  There are no invasive procedures, such as an episiotomy.  You and your spouse or partner will work together, which can increase your bond with each other.  In most delivery centers, your family and friends can be involved in the labor and delivery process. Disadvantages of natural childbirth  You will experience pain during your labor and delivery.  The methods of helping relieve your labor pains may not work for you.  You may feel disappointed if you decide to change your mind during labor and not   have a natural childbirth. After the delivery  You will be very tired.  You will be uncomfortable because of your uterus contracting. You will feel soreness around the vagina.  You may feel cold and shaky. This is a natural reaction. This information is not intended to replace advice given to you by your health care provider. Make sure you discuss any questions you  have with your health care provider. Document Released: 07/20/2008 Document Revised: 01/13/2016 Document Reviewed: 04/14/2013 Elsevier Interactive Patient Education  2017 Elsevier Inc.  

## 2017-12-11 NOTE — Progress Notes (Signed)
   PRENATAL VISIT NOTE  Subjective:  Deborah Lewis is a 22 y.o. G1P0 at 3030w2d being seen today for ongoing prenatal care.  She is currently monitored for the following issues for this high-risk pregnancy and has Supervision of normal first pregnancy, antepartum; Late prenatal care; History of drug abuse; Rh negative, antepartum; Cystic fibrosis carrier; [redacted] weeks gestation of pregnancy; Hereditary disease in family possibly affecting fetus, affecting management of mother, antepartum condition or complication, not applicable or unspecified fetus; and Size of fetus inconsistent with dates, antepartum on their problem list.  Patient reports no complaints.  Contractions: Not present. Vag. Bleeding: None.  Movement: Present. Denies leaking of fluid.   The following portions of the patient's history were reviewed and updated as appropriate: allergies, current medications, past family history, past medical history, past social history, past surgical history and problem list. Problem list updated.  Objective:   Vitals:   12/11/17 1019  BP: 99/67  Pulse: 71  Weight: 104 lb 8 oz (47.4 kg)    Fetal Status:     Movement: Present     General:  Alert, oriented and cooperative. Patient is in no acute distress.  Skin: Skin is warm and dry. No rash noted.   Cardiovascular: Normal heart rate noted  Respiratory: Normal respiratory effort, no problems with respiration noted  Abdomen: Soft, gravid, appropriate for gestational age.  Pain/Pressure: Absent     Pelvic: Cervical exam deferred        Extremities: Normal range of motion.  Edema: None  Mental Status: Normal mood and affect. Normal behavior. Normal judgment and thought content.   Assessment and Plan:  Pregnancy: G1P0 at 3130w2d  1. Supervision of normal first pregnancy, antepartum  2. Rh negative, antepartum Need Rhogam post del pending blood type of infant  3. Late prenatal care  4. Hereditary disease in family possibly affecting fetus,  affecting management of mother, antepartum condition or complication, not applicable or unspecified fetus  5. Cystic fibrosis carrier  6. Size <dates.   Last US revealed 65th%ile  Has US scheduled for May 1  Preterm labor symptoms and general obstetric precautions including but not limited to vaginal bleeding, contractions, leaking of fluid and fetal movement were reviewed in detail with the patient. Please refer to After Visit Summary for other counseling recommendations.  Return in about 2 weeks (around 12/25/2017).  Future Appointments  Date Time Provider Department Center  12/19/2017  9:30 AM [facility] US 1 [facility]    Willodean Rosenthalarolyn Harraway-Smith, MD

## 2017-12-17 ENCOUNTER — Encounter (HOSPITAL_COMMUNITY): Payer: Self-pay

## 2017-12-18 ENCOUNTER — Other Ambulatory Visit: Payer: Self-pay | Admitting: Obstetrics & Gynecology

## 2017-12-18 DIAGNOSIS — N3000 Acute cystitis without hematuria: Secondary | ICD-10-CM

## 2017-12-18 MED ORDER — NITROFURANTOIN MONOHYD MACRO 100 MG PO CAPS: 100.0000 mg | ORAL_CAPSULE | Freq: Two times a day (BID) | ORAL | 1 refills | Status: DC

## 2017-12-19 ENCOUNTER — Ambulatory Visit (HOSPITAL_COMMUNITY)
Admission: RE | Admit: 2017-12-19 | Discharge: 2017-12-19 | Disposition: A | Discharge status: Home / Self Care | Payer: Medicaid Other | Source: Ambulatory Visit | Attending: Obstetrics and Gynecology | Admitting: Obstetrics and Gynecology

## 2017-12-19 ENCOUNTER — Other Ambulatory Visit (HOSPITAL_COMMUNITY): Payer: Self-pay | Admitting: *Deleted

## 2017-12-19 ENCOUNTER — Encounter (HOSPITAL_COMMUNITY): Payer: Self-pay

## 2017-12-19 ENCOUNTER — Other Ambulatory Visit (HOSPITAL_COMMUNITY): Payer: Self-pay | Admitting: Maternal & Fetal Medicine

## 2017-12-19 DIAGNOSIS — O99333 Smoking (tobacco) complicating pregnancy, third trimester: Secondary | ICD-10-CM | POA: Diagnosis not present

## 2017-12-19 DIAGNOSIS — Z3A36 36 weeks gestation of pregnancy: Secondary | ICD-10-CM

## 2017-12-19 DIAGNOSIS — O0933 Supervision of pregnancy with insufficient antenatal care, third trimester: Secondary | ICD-10-CM

## 2017-12-19 DIAGNOSIS — O365930 Maternal care for other known or suspected poor fetal growth, third trimester, not applicable or unspecified: Secondary | ICD-10-CM | POA: Diagnosis present

## 2017-12-19 DIAGNOSIS — Z362 Encounter for other antenatal screening follow-up: Secondary | ICD-10-CM

## 2017-12-19 DIAGNOSIS — O99323 Drug use complicating pregnancy, third trimester: Secondary | ICD-10-CM

## 2017-12-19 DIAGNOSIS — O365910 Maternal care for other known or suspected poor fetal growth, first trimester, not applicable or unspecified: Secondary | ICD-10-CM

## 2017-12-19 DIAGNOSIS — Z363 Encounter for antenatal screening for malformations: Secondary | ICD-10-CM | POA: Diagnosis not present

## 2017-12-19 HISTORY — DX: Other psychoactive substance abuse, in remission: F19.11

## 2017-12-25 ENCOUNTER — Encounter (HOSPITAL_COMMUNITY): Payer: Self-pay

## 2017-12-26 ENCOUNTER — Other Ambulatory Visit (HOSPITAL_COMMUNITY): Payer: Self-pay | Admitting: Obstetrics and Gynecology

## 2017-12-26 ENCOUNTER — Encounter: Payer: Self-pay | Admitting: Obstetrics & Gynecology

## 2017-12-26 ENCOUNTER — Ambulatory Visit (INDEPENDENT_AMBULATORY_CARE_PROVIDER_SITE_OTHER): Payer: Medicaid Other | Admitting: Obstetrics & Gynecology

## 2017-12-26 ENCOUNTER — Other Ambulatory Visit (HOSPITAL_COMMUNITY)
Admission: RE | Admit: 2017-12-26 | Discharge: 2017-12-26 | Disposition: A | Discharge status: Home / Self Care | Payer: Medicaid Other | Source: Ambulatory Visit | Attending: Obstetrics & Gynecology | Admitting: Obstetrics & Gynecology

## 2017-12-26 ENCOUNTER — Ambulatory Visit (HOSPITAL_COMMUNITY)
Admission: RE | Admit: 2017-12-26 | Discharge: 2017-12-26 | Disposition: A | Discharge status: Home / Self Care | Payer: Medicaid Other | Source: Ambulatory Visit | Attending: Obstetrics and Gynecology | Admitting: Obstetrics and Gynecology

## 2017-12-26 ENCOUNTER — Encounter: Payer: Medicaid Other | Admitting: Obstetrics & Gynecology

## 2017-12-26 ENCOUNTER — Encounter (HOSPITAL_COMMUNITY): Payer: Self-pay

## 2017-12-26 ENCOUNTER — Other Ambulatory Visit (HOSPITAL_COMMUNITY): Payer: Self-pay | Admitting: *Deleted

## 2017-12-26 VITALS — BP 111/79 | HR 98 | Wt 104.7 lb

## 2017-12-26 DIAGNOSIS — Z3A37 37 weeks gestation of pregnancy: Secondary | ICD-10-CM | POA: Insufficient documentation

## 2017-12-26 DIAGNOSIS — O99323 Drug use complicating pregnancy, third trimester: Secondary | ICD-10-CM | POA: Diagnosis not present

## 2017-12-26 DIAGNOSIS — O365930 Maternal care for other known or suspected poor fetal growth, third trimester, not applicable or unspecified: Secondary | ICD-10-CM

## 2017-12-26 DIAGNOSIS — O99333 Smoking (tobacco) complicating pregnancy, third trimester: Secondary | ICD-10-CM | POA: Insufficient documentation

## 2017-12-26 DIAGNOSIS — O0990 Supervision of high risk pregnancy, unspecified, unspecified trimester: Secondary | ICD-10-CM

## 2017-12-26 DIAGNOSIS — Z3400 Encounter for supervision of normal first pregnancy, unspecified trimester: Secondary | ICD-10-CM | POA: Insufficient documentation

## 2017-12-26 DIAGNOSIS — O26849 Uterine size-date discrepancy, unspecified trimester: Secondary | ICD-10-CM

## 2017-12-26 DIAGNOSIS — O0993 Supervision of high risk pregnancy, unspecified, third trimester: Secondary | ICD-10-CM

## 2017-12-26 NOTE — Patient Instructions (Signed)
Vaginal Delivery Vaginal delivery means that you will give birth by pushing your baby out of your birth canal (vagina). A team of health care providers will help you before, during, and after vaginal delivery. Birth experiences are unique for every woman and every pregnancy, and birth experiences vary depending on where you choose to give birth. What should I do to prepare for my baby's birth? Before your baby is born, it is important to talk with your health care provider about:  Your labor and delivery preferences. These may include: ? Medicines that you may be given. ? How you will manage your pain. This might include non-medical pain relief techniques or injectable pain relief such as epidural analgesia. ? How you and your baby will be monitored during labor and delivery. ? Who may be in the labor and delivery room with you. ? Your feelings about surgical delivery of your baby (cesarean delivery, or C-section) if this becomes necessary. ? Your feelings about receiving donated blood through an IV tube (blood transfusion) if this becomes necessary.  Whether you are able: ? To take pictures or videos of the birth. ? To eat during labor and delivery. ? To move around, walk, or change positions during labor and delivery.  What to expect after your baby is born, such as: ? Whether delayed umbilical cord clamping and cutting is offered. ? Who will care for your baby right after birth. ? Medicines or tests that may be recommended for your baby. ? Whether breastfeeding is supported in your hospital or birth center. ? How long you will be in the hospital or birth center.  How any medical conditions you have may affect your baby or your labor and delivery experience.  To prepare for your baby's birth, you should also:  Attend all of your health care visits before delivery (prenatal visits) as recommended by your health care provider. This is important.  Prepare your home for your baby's  arrival. Make sure that you have: ? Diapers. ? Baby clothing. ? Feeding equipment. ? Safe sleeping arrangements for you and your baby.  Install a car seat in your vehicle. Have your car seat checked by a certified car seat installer to make sure that it is installed safely.  Think about who will help you with your new baby at home for at least the first several weeks after delivery.  What can I expect when I arrive at the birth center or hospital? Once you are in labor and have been admitted into the hospital or birth center, your health care provider may:  Review your pregnancy history and any concerns you have.  Insert an IV tube into one of your veins. This is used to give you fluids and medicines.  Check your blood pressure, pulse, temperature, and heart rate (vital signs).  Check whether your bag of water (amniotic sac) has broken (ruptured).  Talk with you about your birth plan and discuss pain control options.  Monitoring Your health care provider may monitor your contractions (uterine monitoring) and your baby's heart rate (fetal monitoring). You may need to be monitored:  Often, but not continuously (intermittently).  All the time or for long periods at a time (continuously). Continuous monitoring may be needed if: ? You are taking certain medicines, such as medicine to relieve pain or make your contractions stronger. ? You have pregnancy or labor complications.  Monitoring may be done by:  Placing a special stethoscope or a handheld monitoring device on your abdomen to   check your baby's heartbeat, and feeling your abdomen for contractions. This method of monitoring does not continuously record your baby's heartbeat or your contractions.  Placing monitors on your abdomen (external monitors) to record your baby's heartbeat and the frequency and length of contractions. You may not have to wear external monitors all the time.  Placing monitors inside of your uterus  (internal monitors) to record your baby's heartbeat and the frequency, length, and strength of your contractions. ? Your health care provider may use internal monitors if he or she needs more information about the strength of your contractions or your baby's heart rate. ? Internal monitors are put in place by passing a thin, flexible wire through your vagina and into your uterus. Depending on the type of monitor, it may remain in your uterus or on your baby's head until birth. ? Your health care provider will discuss the benefits and risks of internal monitoring with you and will ask for your permission before inserting the monitors.  Telemetry. This is a type of continuous monitoring that can be done with external or internal monitors. Instead of having to stay in bed, you are able to move around during telemetry. Ask your health care provider if telemetry is an option for you.  Physical exam Your health care provider may perform a physical exam. This may include:  Checking whether your baby is positioned: ? With the head toward your vagina (head-down). This is most common. ? With the head toward the top of your uterus (head-up or breech). If your baby is in a breech position, your health care provider may try to turn your baby to a head-down position so you can deliver vaginally. If it does not seem that your baby can be born vaginally, your provider may recommend surgery to deliver your baby. In rare cases, you may be able to deliver vaginally if your baby is head-up (breech delivery). ? Lying sideways (transverse). Babies that are lying sideways cannot be delivered vaginally.  Checking your cervix to determine: ? Whether it is thinning out (effacing). ? Whether it is opening up (dilating). ? How low your baby has moved into your birth canal.  What are the three stages of labor and delivery?  Normal labor and delivery is divided into the following three stages: Stage 1  Stage 1 is the  longest stage of labor, and it can last for hours or days. Stage 1 includes: ? Early labor. This is when contractions may be irregular, or regular and mild. Generally, early labor contractions are more than 10 minutes apart. ? Active labor. This is when contractions get longer, more regular, more frequent, and more intense. ? The transition phase. This is when contractions happen very close together, are very intense, and may last longer than during any other part of labor.  Contractions generally feel mild, infrequent, and irregular at first. They get stronger, more frequent (about every 2-3 minutes), and more regular as you progress from early labor through active labor and transition.  Many women progress through stage 1 naturally, but you may need help to continue making progress. If this happens, your health care provider may talk with you about: ? Rupturing your amniotic sac if it has not ruptured yet. ? Giving you medicine to help make your contractions stronger and more frequent.  Stage 1 ends when your cervix is completely dilated to 4 inches (10 cm) and completely effaced. This happens at the end of the transition phase. Stage 2  Once   your cervix is completely effaced and dilated to 4 inches (10 cm), you may start to feel an urge to push. It is common for the body to naturally take a rest before feeling the urge to push, especially if you received an epidural or certain other pain medicines. This rest period may last for up to 1-2 hours, depending on your unique labor experience.  During stage 2, contractions are generally less painful, because pushing helps relieve contraction pain. Instead of contraction pain, you may feel stretching and burning pain, especially when the widest part of your baby's head passes through the vaginal opening (crowning).  Your health care provider will closely monitor your pushing progress and your baby's progress through the vagina during stage 2.  Your  health care provider may massage the area of skin between your vaginal opening and anus (perineum) or apply warm compresses to your perineum. This helps it stretch as the baby's head starts to crown, which can help prevent perineal tearing. ? In some cases, an incision may be made in your perineum (episiotomy) to allow the baby to pass through the vaginal opening. An episiotomy helps to make the opening of the vagina larger to allow more room for the baby to fit through.  It is very important to breathe and focus so your health care provider can control the delivery of your baby's head. Your health care provider may have you decrease the intensity of your pushing, to help prevent perineal tearing.  After delivery of your baby's head, the shoulders and the rest of the body generally deliver very quickly and without difficulty.  Once your baby is delivered, the umbilical cord may be cut right away, or this may be delayed for 1-2 minutes, depending on your baby's health. This may vary among health care providers, hospitals, and birth centers.  If you and your baby are healthy enough, your baby may be placed on your chest or abdomen to help maintain the baby's temperature and to help you bond with each other. Some mothers and babies start breastfeeding at this time. Your health care team will dry your baby and help keep your baby warm during this time.  Your baby may need immediate care if he or she: ? Showed signs of distress during labor. ? Has a medical condition. ? Was born too early (prematurely). ? Had a bowel movement before birth (meconium). ? Shows signs of difficulty transitioning from being inside the uterus to being outside of the uterus. If you are planning to breastfeed, your health care team will help you begin a feeding. Stage 3  The third stage of labor starts immediately after the birth of your baby and ends after you deliver the placenta. The placenta is an organ that develops  during pregnancy to provide oxygen and nutrients to your baby in the womb.  Delivering the placenta may require some pushing, and you may have mild contractions. Breastfeeding can stimulate contractions to help you deliver the placenta.  After the placenta is delivered, your uterus should tighten (contract) and become firm. This helps to stop bleeding in your uterus. To help your uterus contract and to control bleeding, your health care provider may: ? Give you medicine by injection, through an IV tube, by mouth, or through your rectum (rectally). ? Massage your abdomen or perform a vaginal exam to remove any blood clots that are left in your uterus. ? Empty your bladder by placing a thin, flexible tube (catheter) into your bladder. ? Encourage   you to breastfeed your baby. After labor is over, you and your baby will be monitored closely to ensure that you are both healthy until you are ready to go home. Your health care team will teach you how to care for yourself and your baby. This information is not intended to replace advice given to you by your health care provider. Make sure you discuss any questions you have with your health care provider. Document Released: 05/16/2008 Document Revised: 02/25/2016 Document Reviewed: 08/22/2015 Elsevier Interactive Patient Education  2018 [employer]  

## 2017-12-26 NOTE — Progress Notes (Signed)
   PRENATAL VISIT NOTE  Subjective:  Deborah Lewis is a 22 y.o. G1P0 at [redacted]w[redacted]d being seen today for ongoing prenatal care.  She is currently monitored for the following issues for this high-risk pregnancy and has Supervision of high risk pregnancy, antepartum; Late prenatal care; History of drug abuse; Rh negative, antepartum; Cystic fibrosis carrier; [redacted] weeks gestation of pregnancy; Hereditary disease in family possibly affecting fetus, affecting management of mother, antepartum condition or complication, not applicable or unspecified fetus; and IUGR (intrauterine growth restriction) affecting care of mother on their problem list.  Patient reports no complaints.  Contractions: Not present. Vag. Bleeding: None.  Movement: Present. Denies leaking of fluid.   The following portions of the patient's history were reviewed and updated as appropriate: allergies, current medications, past family history, past medical history, past social history, past surgical history and problem list. Problem list updated.  Objective:   Vitals:   12/26/17 1608  BP: 111/79  Pulse: 98  Weight: 104 lb 11.2 oz (47.5 kg)    Fetal Status: Fetal Heart Rate (bpm): 135 Fundal Height: 32 cm Movement: Present     General:  Alert, oriented and cooperative. Patient is in no acute distress.  Skin: Skin is warm and dry. No rash noted.   Cardiovascular: Normal heart rate noted  Respiratory: Normal respiratory effort, no problems with respiration noted  Abdomen: Soft, gravid, appropriate for gestational age.  Pain/Pressure: Absent     Pelvic: Cervical exam performed        Extremities: Normal range of motion.  Edema: None  Mental Status: Normal mood and affect. Normal behavior. Normal judgment and thought content.   Assessment and Plan:  Pregnancy: G1P0 at [redacted]w[redacted]d  1. Supervision of normal first pregnancy, antepartum  - Strep Gp B NAA - GC/Chlamydia probe amp ([place])not at [facility]  2. Poor fetal growth affecting  management of mother in third trimester, single or unspecified fetus IUGR, AFI and BPP normal  3. Supervision of high risk pregnancy, antepartum F/u BPP weekly  Term labor symptoms and general obstetric precautions including but not limited to vaginal bleeding, contractions, leaking of fluid and fetal movement were reviewed in detail with the patient. Please refer to After Visit Summary for other counseling recommendations.  Return in about 1 week (around 01/02/2018).  Future Appointments  Date Time Provider Department Center  01/03/2018  9:45 AM [facility] [place] 2 [facility]  01/03/2018  1:30 PM Conan Bowens, MD [facility] None  01/10/2018  9:45 AM [facility] [place] 5 [facility]    Scheryl Darter, MD

## 2017-12-26 NOTE — Addendum Note (Signed)
Encounter addended by: Lenoard Aden, RDMS on: 12/26/2017 3:44 PM  Actions taken: Imaging Exam ended

## 2017-12-27 LAB — GC/CHLAMYDIA PROBE AMP (~~LOC~~) NOT AT ARMC
CHLAMYDIA, DNA PROBE: NEGATIVE
Neisseria Gonorrhea: NEGATIVE

## 2017-12-28 LAB — STREP GP B NAA: STREP GROUP B AG: NEGATIVE

## 2018-01-02 ENCOUNTER — Encounter (HOSPITAL_COMMUNITY): Payer: Self-pay

## 2018-01-02 ENCOUNTER — Inpatient Hospital Stay (HOSPITAL_COMMUNITY)
Admission: AD | Admit: 2018-01-02 | Discharge: 2018-01-03 | DRG: 807 | Disposition: A | Discharge status: Home / Self Care | Payer: Medicaid Other | Source: Ambulatory Visit | Attending: Family Medicine | Admitting: Family Medicine

## 2018-01-02 ENCOUNTER — Other Ambulatory Visit: Payer: Self-pay

## 2018-01-02 DIAGNOSIS — O0990 Supervision of high risk pregnancy, unspecified, unspecified trimester: Secondary | ICD-10-CM

## 2018-01-02 DIAGNOSIS — Z141 Cystic fibrosis carrier: Secondary | ICD-10-CM

## 2018-01-02 DIAGNOSIS — Z3A38 38 weeks gestation of pregnancy: Secondary | ICD-10-CM

## 2018-01-02 DIAGNOSIS — O0930 Supervision of pregnancy with insufficient antenatal care, unspecified trimester: Secondary | ICD-10-CM

## 2018-01-02 DIAGNOSIS — Z87891 Personal history of nicotine dependence: Secondary | ICD-10-CM | POA: Diagnosis not present

## 2018-01-02 DIAGNOSIS — O43123 Velamentous insertion of umbilical cord, third trimester: Secondary | ICD-10-CM | POA: Diagnosis present

## 2018-01-02 DIAGNOSIS — Z3483 Encounter for supervision of other normal pregnancy, third trimester: Secondary | ICD-10-CM | POA: Diagnosis present

## 2018-01-02 DIAGNOSIS — Z6791 Unspecified blood type, Rh negative: Secondary | ICD-10-CM

## 2018-01-02 DIAGNOSIS — O26893 Other specified pregnancy related conditions, third trimester: Secondary | ICD-10-CM | POA: Diagnosis present

## 2018-01-02 DIAGNOSIS — O365990 Maternal care for other known or suspected poor fetal growth, unspecified trimester, not applicable or unspecified: Secondary | ICD-10-CM | POA: Diagnosis present

## 2018-01-02 DIAGNOSIS — O365930 Maternal care for other known or suspected poor fetal growth, third trimester, not applicable or unspecified: Principal | ICD-10-CM | POA: Diagnosis present

## 2018-01-02 DIAGNOSIS — F1911 Other psychoactive substance abuse, in remission: Secondary | ICD-10-CM

## 2018-01-02 DIAGNOSIS — O26899 Other specified pregnancy related conditions, unspecified trimester: Secondary | ICD-10-CM

## 2018-01-02 HISTORY — DX: Other specified health status: Z78.9

## 2018-01-02 LAB — TYPE AND SCREEN
ABO/RH(D): O NEG
ANTIBODY SCREEN: NEGATIVE

## 2018-01-02 LAB — CBC
HEMATOCRIT: 41.6 % (ref 36.0–46.0)
HEMOGLOBIN: 14.5 g/dL (ref 12.0–15.0)
MCH: 31.3 pg (ref 26.0–34.0)
MCHC: 34.9 g/dL (ref 30.0–36.0)
MCV: 89.8 fL (ref 78.0–100.0)
Platelets: 533 10*3/uL — ABNORMAL HIGH (ref 150–400)
RBC: 4.63 MIL/uL (ref 3.87–5.11)
RDW: 14 % (ref 11.5–15.5)
WBC: 19.3 10*3/uL — AB (ref 4.0–10.5)

## 2018-01-02 LAB — RAPID URINE DRUG SCREEN, HOSP PERFORMED
AMPHETAMINES: NOT DETECTED
BARBITURATES: NOT DETECTED
Benzodiazepines: NOT DETECTED
Cocaine: NOT DETECTED
Opiates: NOT DETECTED
TETRAHYDROCANNABINOL: POSITIVE — AB

## 2018-01-02 LAB — RPR: RPR Ser Ql: NONREACTIVE

## 2018-01-02 MED ORDER — SENNOSIDES-DOCUSATE SODIUM 8.6-50 MG PO TABS
2.0000 | ORAL_TABLET | ORAL | Status: DC
  Administered 2018-01-02: 2 via ORAL
  Filled 2018-01-02: qty 2

## 2018-01-02 MED ORDER — IBUPROFEN 600 MG PO TABS
600.0000 mg | ORAL_TABLET | Freq: Four times a day (QID) | ORAL | Status: DC
  Administered 2018-01-02 – 2018-01-03 (×5): 600 mg via ORAL
  Filled 2018-01-02 (×5): qty 1

## 2018-01-02 MED ORDER — LIDOCAINE HCL (PF) 1 % IJ SOLN
30.0000 mL | INTRAMUSCULAR | Status: DC | PRN
  Administered 2018-01-02: 30 mL via SUBCUTANEOUS
  Filled 2018-01-02: qty 30

## 2018-01-02 MED ORDER — SOD CITRATE-CITRIC ACID 500-334 MG/5ML PO SOLN: 30.0000 mL | ORAL | Status: DC | PRN

## 2018-01-02 MED ORDER — TETANUS-DIPHTH-ACELL PERTUSSIS 5-2.5-18.5 LF-MCG/0.5 IM SUSP: 0.5000 mL | Freq: Once | INTRAMUSCULAR | Status: DC

## 2018-01-02 MED ORDER — PHENYLEPHRINE 40 MCG/ML (10ML) SYRINGE FOR IV PUSH (FOR BLOOD PRESSURE SUPPORT): 80.0000 ug | PREFILLED_SYRINGE | INTRAVENOUS | Status: DC | PRN

## 2018-01-02 MED ORDER — FLEET ENEMA 7-19 GM/118ML RE ENEM: 1.0000 | ENEMA | RECTAL | Status: DC | PRN

## 2018-01-02 MED ORDER — LACTATED RINGERS IV SOLN: 500.0000 mL | INTRAVENOUS | Status: DC | PRN

## 2018-01-02 MED ORDER — FENTANYL CITRATE (PF) 100 MCG/2ML IJ SOLN: 100.0000 ug | INTRAMUSCULAR | Status: DC | PRN

## 2018-01-02 MED ORDER — ACETAMINOPHEN 325 MG PO TABS: 650.0000 mg | ORAL_TABLET | ORAL | Status: DC | PRN

## 2018-01-02 MED ORDER — LACTATED RINGERS IV SOLN
INTRAVENOUS | Status: DC
  Administered 2018-01-02: 02:00:00 via INTRAVENOUS

## 2018-01-02 MED ORDER — ZOLPIDEM TARTRATE 5 MG PO TABS: 5.0000 mg | ORAL_TABLET | Freq: Every evening | ORAL | Status: DC | PRN

## 2018-01-02 MED ORDER — PRENATAL MULTIVITAMIN CH
1.0000 | ORAL_TABLET | Freq: Every day | ORAL | Status: DC
  Administered 2018-01-02: 1 via ORAL
  Filled 2018-01-02: qty 1

## 2018-01-02 MED ORDER — DIPHENHYDRAMINE HCL 50 MG/ML IJ SOLN: 12.5000 mg | INTRAMUSCULAR | Status: DC | PRN

## 2018-01-02 MED ORDER — OXYTOCIN 40 UNITS IN LACTATED RINGERS INFUSION - SIMPLE MED
2.5000 [IU]/h | INTRAVENOUS | Status: DC
  Administered 2018-01-02: 2.5 [IU]/h via INTRAVENOUS
  Filled 2018-01-02: qty 1000

## 2018-01-02 MED ORDER — LACTATED RINGERS IV SOLN: 500.0000 mL | Freq: Once | INTRAVENOUS | Status: DC

## 2018-01-02 MED ORDER — COCONUT OIL OIL
1.0000 "application " | TOPICAL_OIL | Status: DC | PRN
  Filled 2018-01-02: qty 120

## 2018-01-02 MED ORDER — OXYCODONE-ACETAMINOPHEN 5-325 MG PO TABS: 2.0000 | ORAL_TABLET | ORAL | Status: DC | PRN

## 2018-01-02 MED ORDER — ACETAMINOPHEN 325 MG PO TABS
650.0000 mg | ORAL_TABLET | ORAL | Status: DC | PRN
  Administered 2018-01-02: 650 mg via ORAL
  Filled 2018-01-02: qty 2

## 2018-01-02 MED ORDER — SIMETHICONE 80 MG PO CHEW: 80.0000 mg | CHEWABLE_TABLET | ORAL | Status: DC | PRN

## 2018-01-02 MED ORDER — FENTANYL 2.5 MCG/ML BUPIVACAINE 1/10 % EPIDURAL INFUSION (WH - ANES): 14.0000 mL/h | INTRAMUSCULAR | Status: DC | PRN

## 2018-01-02 MED ORDER — ONDANSETRON HCL 4 MG/2ML IJ SOLN: 4.0000 mg | Freq: Four times a day (QID) | INTRAMUSCULAR | Status: DC | PRN

## 2018-01-02 MED ORDER — EPHEDRINE 5 MG/ML INJ: 10.0000 mg | INTRAVENOUS | Status: DC | PRN

## 2018-01-02 MED ORDER — OXYCODONE-ACETAMINOPHEN 5-325 MG PO TABS: 1.0000 | ORAL_TABLET | ORAL | Status: DC | PRN

## 2018-01-02 MED ORDER — ONDANSETRON HCL 4 MG PO TABS
4.0000 mg | ORAL_TABLET | ORAL | Status: DC | PRN
Start: 2018-01-02 — End: 2018-01-03

## 2018-01-02 MED ORDER — DIPHENHYDRAMINE HCL 25 MG PO CAPS: 25.0000 mg | ORAL_CAPSULE | Freq: Four times a day (QID) | ORAL | Status: DC | PRN

## 2018-01-02 MED ORDER — BENZOCAINE-MENTHOL 20-0.5 % EX AERO
1.0000 "application " | INHALATION_SPRAY | CUTANEOUS | Status: DC | PRN
  Filled 2018-01-02: qty 56

## 2018-01-02 MED ORDER — DIBUCAINE 1 % RE OINT
1.0000 "application " | TOPICAL_OINTMENT | RECTAL | Status: DC | PRN
  Filled 2018-01-02: qty 28

## 2018-01-02 MED ORDER — WITCH HAZEL-GLYCERIN EX PADS: 1.0000 "application " | MEDICATED_PAD | CUTANEOUS | Status: DC | PRN

## 2018-01-02 MED ORDER — OXYTOCIN BOLUS FROM INFUSION
500.0000 mL | Freq: Once | INTRAVENOUS | Status: AC
  Administered 2018-01-02: 500 mL via INTRAVENOUS

## 2018-01-02 MED ORDER — ONDANSETRON HCL 4 MG/2ML IJ SOLN: 4.0000 mg | INTRAMUSCULAR | Status: DC | PRN

## 2018-01-02 NOTE — MAU Note (Signed)
CTX every 1-2 mins.  No LOF/VB.  Lost some of her mucous plug.  Endorses fetal movement.

## 2018-01-02 NOTE — H&P (Addendum)
Deborah Lewis is a 22 y.o. female presenting for SOL. OB History    Gravida  1   Para      Term      Preterm      AB      Living        SAB      TAB      Ectopic      Multiple      Live Births             Past Medical History:  Diagnosis Date  . History of drug abuse    Past Surgical History:  Procedure Laterality Date  . NO PAST SURGERIES     Family History: family history includes Hypertension in her father. Social History:  reports that she has quit smoking. She smoked 0.25 packs per day. She has never used smokeless tobacco. She reports that she has current or past drug history. Drugs: Heroin and Marijuana. She reports that she does not drink alcohol.     Maternal Diabetes: No Genetic Screening: Abnormal:  Results: Other:CF mutation Maternal Ultrasounds/Referrals: Abnormal:  Findings:   IUGR Fetal Ultrasounds or other Referrals:  None Maternal Substance Abuse:  Yes:  Type: Marijuana, Other: Heroin Significant Maternal Medications:  None Significant Maternal Lab Results:  None Other Comments:  None  ROS Maternal Medical History:  Reason for admission: Contractions.   Contractions: Onset was 1-2 hours ago.   Frequency: regular.   Perceived severity is moderate.    Fetal activity: Perceived fetal activity is normal.   Last perceived fetal movement was within the past hour.    Prenatal Complications - Diabetes: none.    Dilation: 5 Effacement (%): 80 Station: 0 Exam by:: Latricia Heft, RN Last menstrual period 03/28/2017. Exam Physical Exam  Constitutional: She appears well-developed. No distress.  HENT:  Head: Normocephalic and atraumatic.  Eyes: Pupils are equal, round, and reactive to light. Conjunctivae are normal.  Respiratory: Effort normal.  Skin: She is not diaphoretic.  Psychiatric: She has a normal mood and affect. Her behavior is normal.     LMP 03/28/2017 (Approximate)    Prenatal labs: ABO, Rh: O/Negative/-- (03/06  1624) Antibody: Negative (03/06 1624) Rubella: 2.46 (03/06 1624) RPR: Non Reactive (03/25 1056)  HBsAg: Negative (03/06 1624)  HIV: Non Reactive (03/25 1056)  GBS: Negative (05/08 1639)   Assessment/Plan:  22 y.o. G1P0 at [redacted]w[redacted]d being admitted for SOL  1.Labor Progressing well, expectant management - UDS pending  2. Pain control - No epidural  3. ID : GBS negative - URC, GC/Chlamydia pending  4. Contraception - OCP  5. Baby plan - girl, bottle   Felisa Bonier, MD, PGY-1 [facility]- [facility] [place] 01/02/2018, 2:12 AM  OB FELLOW HISTORY AND PHYSICAL ATTESTATION I have seen and examined this patient; I agree with above documentation in the resident's note.   Patient presenting in SOL. Expectant management GBS neg FHT Cat I UDS for hx of dur use  Frederik Pear, MD OB Fellow 01/02/2018

## 2018-01-03 ENCOUNTER — Encounter: Payer: Medicaid Other | Admitting: Obstetrics and Gynecology

## 2018-01-03 ENCOUNTER — Encounter (HOSPITAL_COMMUNITY): Payer: Self-pay

## 2018-01-03 ENCOUNTER — Ambulatory Visit (HOSPITAL_COMMUNITY): Payer: Medicaid Other

## 2018-01-03 LAB — ABO/RH: ABO/RH(D): O NEG

## 2018-01-03 MED ORDER — IBUPROFEN 600 MG PO TABS: 600.0000 mg | ORAL_TABLET | Freq: Four times a day (QID) | ORAL | 0 refills | Status: DC | PRN

## 2018-01-03 NOTE — Discharge Instructions (Signed)
Vaginal Delivery, Care After °Refer to this sheet in the next few weeks. These instructions provide you with information about caring for yourself after vaginal delivery. Your health care provider may also give you more specific instructions. Your treatment has been planned according to current medical practices, but problems sometimes occur. Call your health care provider if you have any problems or questions. °What can I expect after the procedure? °After vaginal delivery, it is common to have: °· Some bleeding from your vagina. °· Soreness in your abdomen, your vagina, and the area of skin between your vaginal opening and your anus (perineum). °· Pelvic cramps. °· Fatigue. ° °Follow these instructions at home: °Medicines °· Take over-the-counter and prescription medicines only as told by your health care provider. °· If you were prescribed an antibiotic medicine, take it as told by your health care provider. Do not stop taking the antibiotic until it is finished. °Driving ° °· Do not drive or operate heavy machinery while taking prescription pain medicine. °· Do not drive for 24 hours if you received a sedative. °Lifestyle °· Do not drink alcohol. This is especially important if you are breastfeeding or taking medicine to relieve pain. °· Do not use tobacco products, including cigarettes, chewing tobacco, or e-cigarettes. If you need help quitting, ask your health care provider. °Eating and drinking °· Drink at least 8 eight-ounce glasses of water every day unless you are told not to by your health care provider. If you choose to breastfeed your baby, you may need to drink more water than this. °· Eat high-fiber foods every day. These foods may help prevent or relieve constipation. High-fiber foods include: °? Whole grain cereals and breads. °? Brown rice. °? Beans. °? Fresh fruits and vegetables. °Activity °· Return to your normal activities as told by your health care provider. Ask your health care provider  what activities are safe for you. °· Rest as much as possible. Try to rest or take a nap when your baby is sleeping. °· Do not lift anything that is heavier than your baby or 10 lb (4.5 kg) until your health care provider says that it is safe. °· Talk with your health care provider about when you can engage in sexual activity. This may depend on your: °? Risk of infection. °? Rate of healing. °? Comfort and desire to engage in sexual activity. °Vaginal Care °· If you have an episiotomy or a vaginal tear, check the area every day for signs of infection. Check for: °? More redness, swelling, or pain. °? More fluid or blood. °? Warmth. °? Pus or a bad smell. °· Do not use tampons or douches until your health care provider says this is safe. °· Watch for any blood clots that may pass from your vagina. These may look like clumps of dark red, brown, or black discharge. °General instructions °· Keep your perineum clean and dry as told by your health care provider. °· Wear loose, comfortable clothing. °· Wipe from front to back when you use the toilet. °· Ask your health care provider if you can shower or take a bath. If you had an episiotomy or a perineal tear during labor and delivery, your health care provider may tell you not to take baths for a certain length of time. °· Wear a bra that supports your breasts and fits you well. °· If possible, have someone help you with household activities and help care for your baby for at least a few days after   you leave the hospital. °· Keep all follow-up visits for you and your baby as told by your health care provider. This is important. °Contact a health care provider if: °· You have: °? Vaginal discharge that has a bad smell. °? Difficulty urinating. °? Pain when urinating. °? A sudden increase or decrease in the frequency of your bowel movements. °? More redness, swelling, or pain around your episiotomy or vaginal tear. °? More fluid or blood coming from your episiotomy or  vaginal tear. °? Pus or a bad smell coming from your episiotomy or vaginal tear. °? A fever. °? A rash. °? Little or no interest in activities you used to enjoy. °? Questions about caring for yourself or your baby. °· Your episiotomy or vaginal tear feels warm to the touch. °· Your episiotomy or vaginal tear is separating or does not appear to be healing. °· Your breasts are painful, hard, or turn red. °· You feel unusually sad or worried. °· You feel nauseous or you vomit. °· You pass large blood clots from your vagina. If you pass a blood clot from your vagina, save it to show to your health care provider. Do not flush blood clots down the toilet without having your health care provider look at them. °· You urinate more than usual. °· You are dizzy or light-headed. °· You have not breastfed at all and you have not had a menstrual period for 12 weeks after delivery. °· You have stopped breastfeeding and you have not had a menstrual period for 12 weeks after you stopped breastfeeding. °Get help right away if: °· You have: °? Pain that does not go away or does not get better with medicine. °? Chest pain. °? Difficulty breathing. °? Blurred vision or spots in your vision. °? Thoughts about hurting yourself or your baby. °· You develop pain in your abdomen or in one of your legs. °· You develop a severe headache. °· You faint. °· You bleed from your vagina so much that you fill two sanitary pads in one hour. °This information is not intended to replace advice given to you by your health care provider. Make sure you discuss any questions you have with your health care provider. °Document Released: 08/04/2000 Document Revised: 01/19/2016 Document Reviewed: 08/22/2015 °Elsevier Interactive Patient Education © 2018 [employer] ° °

## 2018-01-03 NOTE — Clinical Social Work Maternal (Addendum)
CLINICAL SOCIAL WORK MATERNAL/CHILD NOTE  Patient Details  Name: Deborah Lewis MRN: [id number] Date of Birth: [birth year]  Date:  01/03/2018  Clinical Social Worker Initiating Note:  Terri Piedra, [place] Date/Time: Initiated:  01/03/18/1300     Child's Name:  Deborah Lewis   Biological Parents:  Mother, Father([name] and [name])   Need for Interpreter:  None   Reason for Referral:  Current Substance Use/Substance Use During Pregnancy    Address:  [address] [place] [address]    Phone number:  [phone] (home) (FOB), [phone] (MOB)    Additional phone number:   Household Members/Support Persons (HM/SP):   Household Member/Support Person 1   HM/SP Name Relationship DOB or Age  HM/SP -1 [name] FOB/significant other 04/26/82  HM/SP -2  Elly Modena FOB's daughter 12/08/04  HM/SP -3        HM/SP -4        HM/SP -5        HM/SP -6        HM/SP -7        HM/SP -8          Natural Supports (not living in the home):  Parent, Friends, Extended Family, Immediate Family(Parents state they have a great support system of family and friends living near by.)   Professional Supports: None(MOB is open to counseling resources.  FOB attends NA groups and receives treatment at [facility].)   Employment: Self-employed   Type of Work: FOB states he owns his own Pharmacologist business.  MOB plans to be a stay at home mom.   Education:      Homebound arranged:    Financial Resources:  Medicaid   Other Resources:  Physicist, medical , WIC(MOB needs to get her WIC benefits changed from [facility] to [facility].)   Cultural/Religious Considerations Which May Impact Care: None stated.  Strengths:  Ability to meet basic needs , Home prepared for child , Pediatrician chosen, Compliance with medical plan    Psychotropic Medications:         Pediatrician:    [place]  Pediatrician List:   [facility]  Pediatricians(FOB states his other daughter is seen at [facility].)  [facility]      Pediatrician Fax Number:    Risk Factors/Current Problems:  Substance Use    Cognitive State:  Able to Concentrate , Alert , Linear Thinking , Goal Oriented    Mood/Affect:  Calm , Interested    CSW Assessment: CSW met with MOB and FOB in MOB's first floor room/140 to offer support and complete assessment due to [facility] at 30 weeks.  Upon chart review, CSW also notes hx of polysubstance use including heroin use starting in 2016, marijuana use, and a positive UDS in pregnancy (11/21/17) for THC, Morphine, Methadone, and Norfentanyl.  MOB's UDS on admission was positive for THC and baby's UDS is negative. Parents were pleasant and welcoming of CSW's visit.  MOB states she was told that a CSW would be coming to speak with them.  CSW explained role in hospital as support and although there are concerns we need to address, asked for open and honest dialogue and informed them of what to expect from CPS involvement given recent substance use.  Parents seemed receptive to talking about their substance use and current situation.  MOB states she  had not used throughout her entire pregnancy until this week.  CSW asked her to speak about her substance use hx leading up to this week.  She states she started using marijuana as a young teenager and then started using heroin when she was 25.  She states she went to [facility] in March of 2018 to start Methadone treatment and counseling, but was then in jail in April and May 2018 for obtaining property under false pretence (she reports all charges have been dismissed and her record is clean) and had no choice by to detox.  She therefore states that she is "clean."  She admits to smoking marijuana occasionally throughout pregnancy because she was "sick the whole time" and  "couldn't eat."  She states her OB was "getting on me about losing a couple pounds."  She states she smoked marijuana because of this.  She estimates that her last use was 2 weeks ago.  She also states that she used a strip of Suboxone a couple times during the pregnancy "to not think about using."  She then added that it was probably only twice, "once a long time ago and once about 3 weeks ago, and that it was only an eighth of a strip."  She reports that "I accidentally drank his (FOB's) Methadone last Sunday, explaining that he gets take-homes on Sundays and waters it down to help with the taste.  She states that she had a cup of juice next to his cup of Methadone and drank his cup by accident.  She states she spit it out, but probably swallowed some.  She reports that she found a cigarette box with a "little bit" of heroin in it while unpacking boxes at their new house the other day and initially threw it out, but then could not help herself from using it.  She made the distinction that she snorted it as opposed to injecting it, as she typically would have done in the past, as if to say this made it less concerning.  She states, "I know I shouldn't have and I wouldn't have if it wasn't there."  She reports this was about 2 days prior to delivery.  CSW informed MOB her UDS on 11/21/17 was positive for THC, Morphine, Methadone and Fentanyl metabolite.  She adamantly denied using anything other than THC around that time, but then looked at FOB and asked, "did I accidentally drink your Methadone a different time?"  CSW informed her of her UDS result on admission being positive for THC and the baby's UDS being negative, with CDS pending.  MOB replied, "my mind is blown.  I'm telling you I used a couple days ago, but my screen isn't positive (other than THC), but I'm telling you I didn't use anything is April and you're telling me I was positive.  How is that possible?"  CSW is not able to provide explanation to her  screen in April.  CSW suggests that heroin use "about 2 days ago" may not show up in a drug screen since opiates do not stay in the system for an extended period of time.  CSW explained that infant urine drug screens are not the most accurate screens because it is hard to capture the first urine.  FOB agreed that it was hard to collect baby's urine for the screen.  CSW explained that regardless of all of these results, there is no question that baby has been exposed to substances in utero and that CSW  is concerned about recent substance use.  CSW informed parents that CPS will be notified and told them what to expect if CPS becomes involved.  CSW assumes that CDS will be positive, but is concerned to a high enough degree to make a report now, even though infant drug screen is negative.  However, CPS may not accept until CDS result is positive.   CSW recommends counseling for MOB and she was willing to engage in therapy.  CSW will provide MOB with resources.  FOB reports that he entered Methadone treatment at [facility] at the same time that MOB did and that he has been actively working at sobriety.  He reports being sober since May of 2018.  He states he has been battling with addiction for 10 years.  CSW asked MOB if he has had CPS involvement in the past with his 69 year old daughter.  He reports that he did, due to substance use, and states that he worked his plan and regained custody.  He know how "joint custody of her."  She lives in [place] and spends the weekends in the home with MOB and FOB.  They think she will decide to come live with them full-time after this school year.  FOB reports that he started using drugs when his fiance died in a car accident.  FOB states he attends NA meetings in addition to counseling at [facility].  CSW asked MOB if this is something she thinks would benefit her and she agreed to go with FOB to see if she finds this supportive. Parents were attentive to baby as we spoke.   They were calm and appropriate and receptive to CSW's recommendations.  They report having a good support system and state that their parents are aware of their substance use hx and are supportive in their recovery.  They have everything needed for baby at home and MOB was knowledgeable about SIDS and safe sleep.  Parents were engaged and attentive when given information about PMADs.  MOB reports family hx of mental illness, but denies any hx herself.  They are understanding of CSW's need to contact CPS and state willingness to cooperate with CPS worker.      CSW Plan/Description:  Psychosocial Support and Ongoing Assessment of Needs, Sudden Infant Death Syndrome (SIDS) Education, Perinatal Mood and Anxiety Disorder (PMADs) Education, [place], Other Information/Referral to [employer], Child Copy Report , CSW Awaiting CPS Disposition Plan, CSW Will Continue to Monitor Umbilical Cord Tissue Drug Screen Results and Make Report if Barnetta Chapel 01/03/2018, 3:15 PM

## 2018-01-03 NOTE — Discharge Summary (Signed)
OB Discharge Summary     Patient Name: Deborah Lewis DOB: 03-Dec-1995 MRN: [id number]  Date of admission: 01/02/2018 Delivering MD: Frederik Pear   Date of discharge: 01/03/2018  Admitting diagnosis: 37 WEEKS CTX Intrauterine pregnancy: [redacted]w[redacted]d     Secondary diagnosis:  Principal Problem:   Supervision of high risk pregnancy, antepartum Active Problems:   Late prenatal care   History of drug abuse   Rh negative, antepartum   Cystic fibrosis carrier   IUGR (intrauterine growth restriction) affecting care of mother   Normal labor   SVD (spontaneous vaginal delivery)  Additional problems: none     Discharge diagnosis: Term Pregnancy Delivered                                                                                                Post partum procedures:none (mom/baby both Rh neg)  Augmentation: none  Complications: None  Hospital course:  Onset of Labor With Vaginal Delivery     22 y.o. yo G1P1001 at [redacted]w[redacted]d was admitted in Active Labor on 01/02/2018. Patient had an uncomplicated labor course and progressed to precipitous delivery as follows:  Membrane Rupture Time/Date: 2:31 AM ,01/02/2018   Intrapartum Procedures: Episiotomy: None [1]                                         Lacerations:  2nd degree [3]  Patient had a delivery of a Viable infant. 01/02/2018  Information for the patient's newborn:  Aldine Contes Girl Timmia [[id number]]  Delivery Method: Vaginal, Spontaneous(Filed from Delivery Summary)    Pateint had an uncomplicated postpartum course. UDS on admission was +THC. UDS on 4/30 was positive for multiple substances- SW consult pending. She is ambulating, tolerating a regular diet, passing flatus, and urinating well. Patient is discharged home in stable condition on 01/03/18 per her request for early discharge so that she can 'be free to go outside.'   Physical exam  Vitals:   01/02/18 0903 01/02/18 1803 01/02/18 1847 01/03/18 0559  BP: 110/74 107/65 107/71  109/71  Pulse: (!) 57 62 64 (!) 54  Resp:  Temp: 99.5 F (37.5 C) 100 F (37.8 C) 98.7 F (37.1 C) 97.6 F (36.4 C)  TempSrc: Oral  Oral Oral  SpO2: 98% 99%    Weight:      Height:       General: alert and cooperative Lochia: appropriate Uterine Fundus: firm Incision: N/A DVT Evaluation: No evidence of DVT seen on physical exam. Labs: Lab Results  Component Value Date   WBC 19.3 (H) 01/02/2018   HGB 14.5 01/02/2018   HCT 41.6 01/02/2018   MCV 89.8 01/02/2018   PLT 533 (H) 01/02/2018   No flowsheet data found.  Discharge instruction: per After Visit Summary and "Baby and Me Booklet".  After visit meds:  Allergies as of 01/03/2018   No Known Allergies     Medication List    STOP taking these medications   acetaminophen 325 MG tablet  Commonly known as:  TYLENOL   pantoprazole 20 MG tablet Commonly known as:  PROTONIX     TAKE these medications   docusate sodium 100 MG capsule Commonly known as:  COLACE Take 100 mg by mouth daily.   ibuprofen 600 MG tablet Commonly known as:  ADVIL,MOTRIN Take 1 tablet (600 mg total) by mouth every 6 (six) hours as needed.   prenatal multivitamin Tabs tablet Take 1 tablet by mouth daily at 12 noon.       Diet: routine diet  Activity: Advance as tolerated. Pelvic rest for 6 weeks.   Outpatient follow up:4 weeks Follow up Appt: Future Appointments  Date Time Provider Department Center  01/30/2018 10:00 AM Conan Bowens, MD [facility] None   Follow up Visit:No follow-ups on file.  Postpartum contraception: Progesterone only pills  Newborn Data: Live born female  Birth Weight: 4 lb 12.4 oz (2165 g) APGAR: 8, 9  Newborn Delivery   Birth date/time:  01/02/2018 02:44:00 Delivery type:  Vaginal, Spontaneous     Baby Feeding: Breast Disposition:home with mother   01/03/2018 Cam Hai, CNM 9:21 AM

## 2018-01-10 ENCOUNTER — Ambulatory Visit (HOSPITAL_COMMUNITY): Payer: Medicaid Other

## 2018-01-30 ENCOUNTER — Ambulatory Visit: Payer: Medicaid Other | Admitting: Obstetrics and Gynecology

## 2018-02-20 ENCOUNTER — Ambulatory Visit (INDEPENDENT_AMBULATORY_CARE_PROVIDER_SITE_OTHER): Payer: Medicaid Other | Admitting: Obstetrics & Gynecology

## 2018-02-20 ENCOUNTER — Encounter: Payer: Self-pay | Admitting: Obstetrics & Gynecology

## 2018-02-20 MED ORDER — NORGESTIM-ETH ESTRAD TRIPHASIC 0.18/0.215/0.25 MG-25 MCG PO TABS: 1.0000 | ORAL_TABLET | Freq: Every day | ORAL | 11 refills | Status: AC

## 2018-02-20 NOTE — Progress Notes (Signed)
..  Post Partum Exam  Deborah Lewis is a 22 y.o. 861P1001 female who presents for a postpartum visit. She is 7 weeks postpartum following a spontaneous vaginal delivery. I have fully reviewed the prenatal and intrapartum course. The delivery was at 38.3 gestational weeks.  Anesthesia: none. Postpartum course has been good. Baby's course has been good. Baby is feeding by bottle - Similac. Bleeding no bleeding. Bowel function is normal. Bladder function is normal. Patient is not sexually active. Contraception method is none. Postpartum depression screening:neg  The following portions of the patient's history were reviewed and updated as appropriate: allergies, current medications, past family history, past medical history, past social history, past surgical history and problem list. Last pap smear done 2019 and was Normal  Review of Systems Pertinent items are noted in HPI.    Objective:  Last menstrual period 03/28/2017, unknown if currently breastfeeding.  General:  alert, cooperative and no distress           Abdomen: soft, non-tender; bowel sounds normal; no masses,  no organomegaly   Vulva:  not evaluated  Vagina: not evaluated           Rectal Exam: Not performed.        Assessment:    normal postpartum exam. Pap smear not done at today's visit.   Plan:   1. Contraception: OCP (estrogen/progesterone) 2. Advised to wait 1 week after starting OCP before unprotected intercourse 3. Follow up as needed.   Adam PhenixArnold, James G, MD 02/20/2018

## 2018-02-20 NOTE — Patient Instructions (Signed)
Oral Contraception Use Oral contraceptive pills (OCPs) are medicines taken to prevent pregnancy. OCPs work by preventing the ovaries from releasing eggs. The hormones in OCPs also cause the cervical mucus to thicken, preventing the sperm from entering the uterus. The hormones also cause the uterine lining to become thin, not allowing a fertilized egg to attach to the inside of the uterus. OCPs are highly effective when taken exactly as prescribed. However, OCPs do not prevent sexually transmitted diseases (STDs). Safe sex practices, such as using condoms along with an OCP, can help prevent STDs. Before taking OCPs, you may have a physical exam and Pap test. Your health care provider may also order blood tests if necessary. Your health care provider will make sure you are a good candidate for oral contraception. Discuss with your health care provider the possible side effects of the OCP you may be prescribed. When starting an OCP, it can take 2 to 3 months for the body to adjust to the changes in hormone levels in your body. How to take oral contraceptive pills Your health care provider may advise you on how to start taking the first cycle of OCPs. Otherwise, you can:  Start on day 1 of your menstrual period. You will not need any backup contraceptive protection with this start time.  Start on the first Sunday after your menstrual period or the day you get your prescription. In these cases, you will need to use backup contraceptive protection for the first week.  Start the pill at any time of your cycle. If you take the pill within 5 days of the start of your period, you are protected against pregnancy right away. In this case, you will not need a backup form of birth control. If you start at any other time of your menstrual cycle, you will need to use another form of birth control for 7 days. If your OCP is the type called a minipill, it will protect you from pregnancy after taking it for 2 days (48  hours).  After you have started taking OCPs:  If you forget to take 1 pill, take it as soon as you remember. Take the next pill at the regular time.  If you miss 2 or more pills, call your health care provider because different pills have different instructions for missed doses. Use backup birth control until your next menstrual period starts.  If you use a 28-day pack that contains inactive pills and you miss 1 of the last 7 pills (pills with no hormones), it will not matter. Throw away the rest of the non-hormone pills and start a new pill pack.  No matter which day you start the OCP, you will always start a new pack on that same day of the week. Have an extra pack of OCPs and a backup contraceptive method available in case you miss some pills or lose your OCP pack. Follow these instructions at home:  Do not smoke.  Always use a condom to protect against STDs. OCPs do not protect against STDs.  Use a calendar to mark your menstrual period days.  Read the information and directions that came with your OCP. Talk to your health care provider if you have questions. Contact a health care provider if:  You develop nausea and vomiting.  You have abnormal vaginal discharge or bleeding.  You develop a rash.  You miss your menstrual period.  You are losing your hair.  You need treatment for mood swings or depression.  You   get dizzy when taking the OCP.  You develop acne from taking the OCP.  You become pregnant. Get help right away if:  You develop chest pain.  You develop shortness of breath.  You have an uncontrolled or severe headache.  You develop numbness or slurred speech.  You develop visual problems.  You develop pain, redness, and swelling in the legs. This information is not intended to replace advice given to you by your health care provider. Make sure you discuss any questions you have with your health care provider. Document Released: 07/27/2011 Document  Revised: 01/13/2016 Document Reviewed: 01/26/2013 Elsevier Interactive Patient Education  2017 [employer] Inc[employer]  

## 2018-08-02 ENCOUNTER — Emergency Department (HOSPITAL_COMMUNITY)
Admission: EM | Admit: 2018-08-02 | Discharge: 2018-08-02 | Disposition: A | Discharge status: Home / Self Care | Payer: Medicaid Other | Attending: Emergency Medicine | Admitting: Emergency Medicine

## 2018-08-02 ENCOUNTER — Other Ambulatory Visit: Payer: Self-pay

## 2018-08-02 ENCOUNTER — Encounter (HOSPITAL_COMMUNITY): Payer: Self-pay

## 2018-08-02 ENCOUNTER — Emergency Department (HOSPITAL_COMMUNITY): Payer: Medicaid Other

## 2018-08-02 DIAGNOSIS — F17210 Nicotine dependence, cigarettes, uncomplicated: Secondary | ICD-10-CM | POA: Diagnosis not present

## 2018-08-02 DIAGNOSIS — M25562 Pain in left knee: Secondary | ICD-10-CM | POA: Diagnosis not present

## 2018-08-02 DIAGNOSIS — M7918 Myalgia, other site: Secondary | ICD-10-CM

## 2018-08-02 DIAGNOSIS — M545 Low back pain: Secondary | ICD-10-CM | POA: Diagnosis present

## 2018-08-02 DIAGNOSIS — Z79899 Other long term (current) drug therapy: Secondary | ICD-10-CM | POA: Insufficient documentation

## 2018-08-02 LAB — POC URINE PREG, ED: Preg Test, Ur: NEGATIVE

## 2018-08-02 NOTE — ED Notes (Signed)
Pt ambulatory to restroom
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
## 2018-08-03 ENCOUNTER — Encounter (HOSPITAL_COMMUNITY): Payer: Self-pay | Admitting: Emergency Medicine

## 2018-08-03 ENCOUNTER — Emergency Department (HOSPITAL_COMMUNITY)
Admission: EM | Admit: 2018-08-03 | Discharge: 2018-08-03 | Disposition: A | Discharge status: Home / Self Care | Payer: Medicaid Other | Attending: Emergency Medicine | Admitting: Emergency Medicine

## 2018-08-03 ENCOUNTER — Other Ambulatory Visit: Payer: Self-pay

## 2018-08-03 DIAGNOSIS — S8002XD Contusion of left knee, subsequent encounter: Secondary | ICD-10-CM | POA: Insufficient documentation

## 2018-08-03 DIAGNOSIS — F17210 Nicotine dependence, cigarettes, uncomplicated: Secondary | ICD-10-CM | POA: Diagnosis not present

## 2018-08-03 DIAGNOSIS — Z79899 Other long term (current) drug therapy: Secondary | ICD-10-CM | POA: Diagnosis not present

## 2018-08-03 DIAGNOSIS — S39012S Strain of muscle, fascia and tendon of lower back, sequela: Secondary | ICD-10-CM | POA: Diagnosis not present

## 2018-08-03 DIAGNOSIS — S8992XD Unspecified injury of left lower leg, subsequent encounter: Secondary | ICD-10-CM | POA: Diagnosis present

## 2018-08-03 MED ORDER — CYCLOBENZAPRINE HCL 5 MG PO TABS: 5.0000 mg | ORAL_TABLET | Freq: Every evening | ORAL | 0 refills | Status: AC | PRN
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
## 2018-08-03 NOTE — Progress Notes (Signed)
Orthopedic Tech Progress Note Patient Details:  Deborah Lewis Sep 17, 1995 [id number] Pt did have a small knee so I ended up putting an ace on it before putting sleeve on. Patient ID: Deborah FlashAlli N Lewis, female   DOB: Sep 17, 1995, 22 y.o.   MRN: [id number]   Tawni CarnesHarrison, Chad Tiznado [facility] 08/03/2018, 7:13 PM

## 2018-08-03 NOTE — Discharge Instructions (Addendum)
Follow-up with your primary care doctor as needed
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
## 2018-08-05 ENCOUNTER — Emergency Department (HOSPITAL_COMMUNITY)
Admission: EM | Admit: 2018-08-05 | Discharge: 2018-08-05 | Disposition: A | Discharge status: Home / Self Care | Payer: Medicaid Other | Attending: Emergency Medicine | Admitting: Emergency Medicine

## 2018-08-05 ENCOUNTER — Encounter (HOSPITAL_COMMUNITY): Payer: Self-pay | Admitting: Emergency Medicine

## 2018-08-05 ENCOUNTER — Emergency Department (HOSPITAL_COMMUNITY): Payer: Medicaid Other

## 2018-08-05 DIAGNOSIS — F17210 Nicotine dependence, cigarettes, uncomplicated: Secondary | ICD-10-CM | POA: Diagnosis not present

## 2018-08-05 DIAGNOSIS — Y92410 Unspecified street and highway as the place of occurrence of the external cause: Secondary | ICD-10-CM | POA: Insufficient documentation

## 2018-08-05 DIAGNOSIS — Z79899 Other long term (current) drug therapy: Secondary | ICD-10-CM | POA: Diagnosis not present

## 2018-08-05 DIAGNOSIS — M25562 Pain in left knee: Secondary | ICD-10-CM | POA: Insufficient documentation

## 2018-08-05 DIAGNOSIS — Y999 Unspecified external cause status: Secondary | ICD-10-CM | POA: Diagnosis not present

## 2018-08-05 DIAGNOSIS — S0990XD Unspecified injury of head, subsequent encounter: Secondary | ICD-10-CM

## 2018-08-05 DIAGNOSIS — S0990XA Unspecified injury of head, initial encounter: Secondary | ICD-10-CM | POA: Insufficient documentation

## 2018-08-05 DIAGNOSIS — Y9389 Activity, other specified: Secondary | ICD-10-CM | POA: Insufficient documentation

## 2018-08-05 MED ORDER — DICLOFENAC SODIUM 1 % TD GEL
2.0000 g | Freq: Once | TRANSDERMAL | Status: DC
  Filled 2018-08-05: qty 100

## 2018-08-05 NOTE — Discharge Instructions (Addendum)
Head Injury You have been seen today for a head injury. It does not appear to be serious at this time.  Close observation: The close observation period is usually 6 hours from the injury. This includes staying awake and having a trustworthy adult monitor you to assure your condition does not worsen. You should be in regular contact with this person and ideally, they should be able to monitor you in person.  Secondary observation: The secondary observation period is usually 24 hours from the injury. You are allowed to sleep during this time. A trustworthy adult should intermittently monitor you to assure your condition does not worsen.   Overall head injury/concussion care: Rest: Be sure to get plenty of rest. You will need more rest and sleep while you recover. Hydration: Be sure to stay well hydrated by having a goal of drinking about 0.5 liters of water an hour. Pain:  Antiinflammatory medications: Take 600 mg of ibuprofen every 6 hours or 440 mg (over the counter dose) to 500 mg (prescription dose) of naproxen every 12 hours or for the next 3 days. After this time, these medications may be used as needed for pain. Take these medications with food to avoid upset stomach. Choose only one of these medications, do not take them together. Tylenol: Should you continue to have additional pain while taking the ibuprofen or naproxen, you may add in tylenol as needed. Your daily total maximum amount of tylenol from all sources should be limited to 4000mg /day for persons without liver problems, or 2000mg /day for those with liver problems. Return to sports and activities: In general, you may return to normal activities once symptoms have subsided, however, you would ideally be cleared by a primary care provider or other qualified medical professional prior to return to these activities.  Follow up: Follow up with the concussion clinic or your primary care provider for further management of this issue. Return:  Return to the ED should you begin to have confusion, abnormal behavior, aggression, violence, or personality changes, repeated vomiting, vision loss, numbness or weakness on one side of the body, difficulty standing due to dizziness, significantly worsening pain, or any other major concerns.  You have been seen today for a knee injury. There were no acute abnormalities on the x-rays, including no sign of fracture or dislocation, however, there could be injuries to the soft tissues, such as the ligaments or tendons that are not seen on xrays. There could also be what are called occult fractures that are small fractures not seen on xray. Antiinflammatory medications: Take 600 mg of ibuprofen every 6 hours or 440 mg (over the counter dose) to 500 mg (prescription dose) of naproxen every 12 hours for the next 3 days. After this time, these medications may be used as needed for pain. Take these medications with food to avoid upset stomach. Choose only one of these medications, do not take them together. Acetaminophen (generic for Tylenol): Should you continue to have additional pain while taking the ibuprofen or naproxen, you may add in acetaminophen as needed. Your daily total maximum amount of acetaminophen from all sources should be limited to 4000mg /day for persons without liver problems, or 2000mg /day for those with liver problems. Diclofenac gel: May apply this anti-inflammatory gel to the painful areas on the knee, as needed, up to 4 times a day. Ice: May apply ice to the area over the next 24 hours for 15 minutes at a time to reduce swelling. Elevation: Keep the extremity elevated as often  as possible to reduce pain and inflammation. Support: Wear the ACE wrap for support and comfort. Wear this until pain resolves.  Exercises: Start by performing these exercises a few times a week, increasing the frequency until you are performing them twice daily.  Follow up: If symptoms are improving, you may follow  up with your primary care provider for any continued management. If symptoms are not starting to improve within a week, you should follow up with the orthopedic specialist within two weeks. Return: Return to the ED for numbness, weakness, increasing pain, overall worsening symptoms, loss of function, or if symptoms are not improving, you have tried to follow up with the orthopedic specialist, and have been unable to do so.  For prescription assistance, may try using prescription discount sites or apps, such as goodrx.com
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
## 2018-09-07 ENCOUNTER — Emergency Department (HOSPITAL_COMMUNITY)
Admission: EM | Admit: 2018-09-07 | Discharge: 2018-09-07 | Disposition: A | Discharge status: Home / Self Care | Payer: Medicaid Other | Attending: Emergency Medicine | Admitting: Emergency Medicine

## 2018-09-07 ENCOUNTER — Encounter (HOSPITAL_COMMUNITY): Payer: Self-pay

## 2018-09-07 ENCOUNTER — Emergency Department (HOSPITAL_COMMUNITY): Payer: Medicaid Other

## 2018-09-07 DIAGNOSIS — F17210 Nicotine dependence, cigarettes, uncomplicated: Secondary | ICD-10-CM | POA: Diagnosis not present

## 2018-09-07 DIAGNOSIS — S199XXA Unspecified injury of neck, initial encounter: Secondary | ICD-10-CM | POA: Diagnosis present

## 2018-09-07 DIAGNOSIS — Y929 Unspecified place or not applicable: Secondary | ICD-10-CM | POA: Diagnosis not present

## 2018-09-07 DIAGNOSIS — Z79899 Other long term (current) drug therapy: Secondary | ICD-10-CM | POA: Insufficient documentation

## 2018-09-07 DIAGNOSIS — M62838 Other muscle spasm: Secondary | ICD-10-CM | POA: Diagnosis not present

## 2018-09-07 DIAGNOSIS — S161XXA Strain of muscle, fascia and tendon at neck level, initial encounter: Secondary | ICD-10-CM | POA: Insufficient documentation

## 2018-09-07 DIAGNOSIS — Y939 Activity, unspecified: Secondary | ICD-10-CM | POA: Insufficient documentation

## 2018-09-07 DIAGNOSIS — Y999 Unspecified external cause status: Secondary | ICD-10-CM | POA: Diagnosis not present

## 2018-09-07 MED ORDER — CYCLOBENZAPRINE HCL 10 MG PO TABS: 10.0000 mg | ORAL_TABLET | Freq: Three times a day (TID) | ORAL | 0 refills | Status: AC | PRN

## 2018-09-07 MED ORDER — NAPROXEN 500 MG PO TABS: 500.0000 mg | ORAL_TABLET | Freq: Two times a day (BID) | ORAL | 0 refills | Status: AC | PRN
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
## 2018-09-07 NOTE — Discharge Instructions (Addendum)
Your neck xrays look fine today. Take naprosyn as directed for inflammation and pain (take on a schedule 2x/day with food for the next 2-3 days, then as needed thereafter) with tylenol for breakthrough pain and flexeril for muscle relaxation. Do not drive or operate machinery with muscle relaxant use. Use heat to areas of soreness, no more than 20 minutes at a time every hour . Expect to be sore for the next few days and weeks and follow up with primary care physician for recheck of ongoing symptoms in the next 1-2 weeks. Return to ER for emergent changing or worsening of symptoms.
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
## 2021-03-17 ENCOUNTER — Institutional Professional Consult (permissible substitution): Payer: Medicaid Other | Admitting: Plastic Surgery
